# Patient Record
Sex: Male | Born: 1935 | Race: Asian | Hispanic: No | Smoking: Never smoker
Health system: Southern US, Community
[De-identification: ages and names within clinical notes are randomized; demographics above are authoritative.]

## PROBLEM LIST (undated history)

## (undated) DIAGNOSIS — E039 Hypothyroidism, unspecified: Secondary | ICD-10-CM

## (undated) DIAGNOSIS — E119 Type 2 diabetes mellitus without complications: Secondary | ICD-10-CM

## (undated) DIAGNOSIS — E785 Hyperlipidemia, unspecified: Secondary | ICD-10-CM

## (undated) DIAGNOSIS — E878 Other disorders of electrolyte and fluid balance, not elsewhere classified: Secondary | ICD-10-CM

## (undated) DIAGNOSIS — I10 Essential (primary) hypertension: Secondary | ICD-10-CM

## (undated) HISTORY — PX: CHOLECYSTECTOMY: SHX55

## (undated) HISTORY — DX: Type 2 diabetes mellitus without complications: E11.9

## (undated) HISTORY — DX: Hyperlipidemia, unspecified: E78.5

## (undated) HISTORY — PX: BACK SURGERY: SHX140

---

## 1998-10-13 ENCOUNTER — Ambulatory Visit (HOSPITAL_COMMUNITY): Admission: RE | Admit: 1998-10-13 | Discharge: 1998-10-13 | Payer: Self-pay | Admitting: Gastroenterology

## 1999-01-09 ENCOUNTER — Encounter: Admission: RE | Admit: 1999-01-09 | Discharge: 1999-01-09 | Payer: Self-pay | Admitting: Emergency Medicine

## 1999-01-09 ENCOUNTER — Encounter: Payer: Self-pay | Admitting: Emergency Medicine

## 1999-10-03 ENCOUNTER — Encounter: Payer: Self-pay | Admitting: Gastroenterology

## 1999-10-03 ENCOUNTER — Encounter: Admission: RE | Admit: 1999-10-03 | Discharge: 1999-10-03 | Payer: Self-pay | Admitting: Gastroenterology

## 2000-06-20 ENCOUNTER — Encounter (INDEPENDENT_AMBULATORY_CARE_PROVIDER_SITE_OTHER): Payer: Self-pay | Admitting: *Deleted

## 2000-06-20 ENCOUNTER — Encounter: Payer: Self-pay | Admitting: Gastroenterology

## 2000-06-20 ENCOUNTER — Encounter: Admission: RE | Admit: 2000-06-20 | Discharge: 2000-06-20 | Payer: Self-pay | Admitting: Gastroenterology

## 2000-07-02 ENCOUNTER — Encounter: Admission: RE | Admit: 2000-07-02 | Discharge: 2000-07-02 | Payer: Self-pay | Admitting: Gastroenterology

## 2000-07-02 ENCOUNTER — Encounter: Payer: Self-pay | Admitting: Gastroenterology

## 2000-07-02 ENCOUNTER — Encounter (INDEPENDENT_AMBULATORY_CARE_PROVIDER_SITE_OTHER): Payer: Self-pay | Admitting: *Deleted

## 2000-07-18 ENCOUNTER — Encounter (INDEPENDENT_AMBULATORY_CARE_PROVIDER_SITE_OTHER): Payer: Self-pay | Admitting: *Deleted

## 2000-07-18 ENCOUNTER — Encounter: Payer: Self-pay | Admitting: Cardiovascular Disease

## 2000-07-18 ENCOUNTER — Encounter: Admission: RE | Admit: 2000-07-18 | Discharge: 2000-07-18 | Payer: Self-pay | Admitting: Cardiovascular Disease

## 2000-07-22 ENCOUNTER — Ambulatory Visit (HOSPITAL_COMMUNITY): Admission: RE | Admit: 2000-07-22 | Discharge: 2000-07-22 | Payer: Self-pay | Admitting: Cardiovascular Disease

## 2000-10-09 ENCOUNTER — Encounter (INDEPENDENT_AMBULATORY_CARE_PROVIDER_SITE_OTHER): Payer: Self-pay | Admitting: *Deleted

## 2000-10-09 ENCOUNTER — Encounter: Payer: Self-pay | Admitting: Gastroenterology

## 2000-10-09 ENCOUNTER — Encounter: Admission: RE | Admit: 2000-10-09 | Discharge: 2000-10-09 | Payer: Self-pay | Admitting: Gastroenterology

## 2000-10-29 ENCOUNTER — Encounter: Admission: RE | Admit: 2000-10-29 | Discharge: 2000-10-29 | Payer: Self-pay | Admitting: Orthopedic Surgery

## 2000-10-29 ENCOUNTER — Encounter: Payer: Self-pay | Admitting: Orthopedic Surgery

## 2000-10-30 ENCOUNTER — Ambulatory Visit (HOSPITAL_BASED_OUTPATIENT_CLINIC_OR_DEPARTMENT_OTHER): Admission: RE | Admit: 2000-10-30 | Discharge: 2000-10-30 | Payer: Self-pay | Admitting: Orthopedic Surgery

## 2000-11-14 ENCOUNTER — Encounter: Admission: RE | Admit: 2000-11-14 | Discharge: 2000-12-11 | Payer: Self-pay | Admitting: Orthopedic Surgery

## 2002-04-03 ENCOUNTER — Ambulatory Visit (HOSPITAL_COMMUNITY): Admission: RE | Admit: 2002-04-03 | Discharge: 2002-04-03 | Payer: Self-pay

## 2002-04-03 ENCOUNTER — Encounter (INDEPENDENT_AMBULATORY_CARE_PROVIDER_SITE_OTHER): Payer: Self-pay | Admitting: *Deleted

## 2002-12-18 ENCOUNTER — Encounter (INDEPENDENT_AMBULATORY_CARE_PROVIDER_SITE_OTHER): Payer: Self-pay | Admitting: Specialist

## 2002-12-18 ENCOUNTER — Inpatient Hospital Stay (HOSPITAL_COMMUNITY): Admission: RE | Admit: 2002-12-18 | Discharge: 2002-12-20 | Payer: Self-pay | Admitting: Urology

## 2005-07-10 ENCOUNTER — Ambulatory Visit: Payer: Self-pay | Admitting: Internal Medicine

## 2006-09-02 ENCOUNTER — Ambulatory Visit: Payer: Self-pay | Admitting: Internal Medicine

## 2006-09-02 DIAGNOSIS — Z8719 Personal history of other diseases of the digestive system: Secondary | ICD-10-CM | POA: Insufficient documentation

## 2006-09-02 DIAGNOSIS — E039 Hypothyroidism, unspecified: Secondary | ICD-10-CM | POA: Insufficient documentation

## 2006-09-02 DIAGNOSIS — R079 Chest pain, unspecified: Secondary | ICD-10-CM | POA: Insufficient documentation

## 2006-09-02 DIAGNOSIS — E119 Type 2 diabetes mellitus without complications: Secondary | ICD-10-CM | POA: Insufficient documentation

## 2006-09-02 DIAGNOSIS — F5102 Adjustment insomnia: Secondary | ICD-10-CM | POA: Insufficient documentation

## 2006-09-02 LAB — CONVERTED CEMR LAB
Bilirubin Urine: NEGATIVE
Glucose, Urine, Semiquant: NEGATIVE
Ketones, urine, test strip: NEGATIVE
Nitrite: NEGATIVE
Specific Gravity, Urine: 1.025

## 2006-09-03 ENCOUNTER — Ambulatory Visit: Payer: Self-pay | Admitting: Internal Medicine

## 2006-09-03 LAB — CONVERTED CEMR LAB
CO2: 28 meq/L (ref 19–32)
Calcium: 9.3 mg/dL (ref 8.4–10.5)
Chloride: 102 meq/L (ref 96–112)
Glucose, Bld: 132 mg/dL — ABNORMAL HIGH (ref 70–99)
INR: 1 (ref 0.9–2.0)
Lymphocytes Relative: 35.8 % (ref 12.0–46.0)
MCV: 93.3 fL (ref 78.0–100.0)
Monocytes Relative: 8.8 % (ref 3.0–11.0)
Neutro Abs: 2.8 10*3/uL (ref 1.4–7.7)
Platelets: 207 10*3/uL (ref 150–400)
Potassium: 4.3 meq/L (ref 3.5–5.1)
RBC: 4.38 M/uL (ref 4.22–5.81)
WBC: 6 10*3/uL (ref 4.5–10.5)

## 2006-09-05 ENCOUNTER — Encounter: Payer: Self-pay | Admitting: Internal Medicine

## 2006-09-05 ENCOUNTER — Ambulatory Visit: Payer: Self-pay | Admitting: Internal Medicine

## 2006-09-05 ENCOUNTER — Inpatient Hospital Stay (HOSPITAL_BASED_OUTPATIENT_CLINIC_OR_DEPARTMENT_OTHER): Admission: RE | Admit: 2006-09-05 | Discharge: 2006-09-05 | Payer: Self-pay | Admitting: Internal Medicine

## 2006-09-17 ENCOUNTER — Ambulatory Visit: Payer: Self-pay | Admitting: Internal Medicine

## 2006-09-17 LAB — CONVERTED CEMR LAB
CO2: 27 meq/L (ref 19–32)
Creatinine, Ser: 1.2 mg/dL (ref 0.4–1.5)
GFR calc Af Amer: 75 mL/min
GFR calc non Af Amer: 62 mL/min
Glucose, Bld: 143 mg/dL — ABNORMAL HIGH (ref 70–99)
Sodium: 136 meq/L (ref 135–145)

## 2006-09-19 ENCOUNTER — Encounter (HOSPITAL_COMMUNITY): Admission: RE | Admit: 2006-09-19 | Discharge: 2006-10-13 | Payer: Self-pay | Admitting: Internal Medicine

## 2006-09-23 ENCOUNTER — Ambulatory Visit: Payer: Self-pay | Admitting: Cardiology

## 2006-09-23 ENCOUNTER — Encounter (INDEPENDENT_AMBULATORY_CARE_PROVIDER_SITE_OTHER): Payer: Self-pay | Admitting: *Deleted

## 2006-10-09 ENCOUNTER — Encounter (INDEPENDENT_AMBULATORY_CARE_PROVIDER_SITE_OTHER): Payer: Self-pay | Admitting: *Deleted

## 2007-01-06 ENCOUNTER — Encounter: Payer: Self-pay | Admitting: Internal Medicine

## 2007-02-19 ENCOUNTER — Encounter: Payer: Self-pay | Admitting: Internal Medicine

## 2007-03-17 ENCOUNTER — Ambulatory Visit: Payer: Self-pay | Admitting: Internal Medicine

## 2007-03-17 DIAGNOSIS — I2510 Atherosclerotic heart disease of native coronary artery without angina pectoris: Secondary | ICD-10-CM | POA: Insufficient documentation

## 2007-03-17 DIAGNOSIS — E785 Hyperlipidemia, unspecified: Secondary | ICD-10-CM

## 2007-03-21 ENCOUNTER — Telehealth (INDEPENDENT_AMBULATORY_CARE_PROVIDER_SITE_OTHER): Payer: Self-pay | Admitting: *Deleted

## 2007-03-21 LAB — CONVERTED CEMR LAB
ALT: 23 units/L (ref 0–53)
Hgb A1c MFr Bld: 6.3 % — ABNORMAL HIGH (ref 4.6–6.0)
Microalb, Ur: 1.5 mg/dL (ref 0.0–1.9)

## 2007-06-16 ENCOUNTER — Ambulatory Visit: Payer: Self-pay | Admitting: *Deleted

## 2007-06-17 ENCOUNTER — Encounter (INDEPENDENT_AMBULATORY_CARE_PROVIDER_SITE_OTHER): Payer: Self-pay | Admitting: *Deleted

## 2007-06-17 ENCOUNTER — Observation Stay (HOSPITAL_COMMUNITY): Admission: EM | Admit: 2007-06-17 | Discharge: 2007-06-17 | Payer: Self-pay | Admitting: Emergency Medicine

## 2007-06-18 ENCOUNTER — Telehealth (INDEPENDENT_AMBULATORY_CARE_PROVIDER_SITE_OTHER): Payer: Self-pay | Admitting: *Deleted

## 2007-06-18 ENCOUNTER — Ambulatory Visit: Payer: Self-pay | Admitting: Internal Medicine

## 2007-06-18 DIAGNOSIS — I10 Essential (primary) hypertension: Secondary | ICD-10-CM | POA: Insufficient documentation

## 2007-06-18 DIAGNOSIS — R339 Retention of urine, unspecified: Secondary | ICD-10-CM

## 2007-11-19 ENCOUNTER — Encounter: Admission: RE | Admit: 2007-11-19 | Discharge: 2007-12-29 | Payer: Self-pay | Admitting: Specialist

## 2007-11-20 ENCOUNTER — Encounter (INDEPENDENT_AMBULATORY_CARE_PROVIDER_SITE_OTHER): Payer: Self-pay | Admitting: *Deleted

## 2007-11-20 ENCOUNTER — Ambulatory Visit (HOSPITAL_COMMUNITY): Admission: RE | Admit: 2007-11-20 | Discharge: 2007-11-20 | Payer: Self-pay | Admitting: Specialist

## 2007-12-04 ENCOUNTER — Ambulatory Visit (HOSPITAL_COMMUNITY): Admission: RE | Admit: 2007-12-04 | Discharge: 2007-12-04 | Payer: Self-pay | Admitting: Specialist

## 2008-02-25 ENCOUNTER — Encounter: Payer: Self-pay | Admitting: Internal Medicine

## 2008-04-21 ENCOUNTER — Encounter: Payer: Self-pay | Admitting: Internal Medicine

## 2008-06-07 ENCOUNTER — Encounter: Payer: Self-pay | Admitting: Internal Medicine

## 2008-10-06 ENCOUNTER — Encounter: Admission: RE | Admit: 2008-10-06 | Discharge: 2008-10-11 | Payer: Self-pay | Admitting: Specialist

## 2008-11-28 ENCOUNTER — Inpatient Hospital Stay (HOSPITAL_COMMUNITY): Admission: EM | Admit: 2008-11-28 | Discharge: 2008-11-30 | Payer: Self-pay | Admitting: Emergency Medicine

## 2008-11-29 ENCOUNTER — Encounter (INDEPENDENT_AMBULATORY_CARE_PROVIDER_SITE_OTHER): Payer: Self-pay | Admitting: Internal Medicine

## 2008-12-08 ENCOUNTER — Encounter: Admission: RE | Admit: 2008-12-08 | Discharge: 2009-01-10 | Payer: Self-pay | Admitting: Internal Medicine

## 2008-12-30 ENCOUNTER — Encounter: Payer: Self-pay | Admitting: Internal Medicine

## 2009-05-09 ENCOUNTER — Encounter: Payer: Self-pay | Admitting: Internal Medicine

## 2009-07-18 ENCOUNTER — Encounter: Payer: Self-pay | Admitting: Internal Medicine

## 2009-10-05 ENCOUNTER — Encounter: Payer: Self-pay | Admitting: Internal Medicine

## 2009-10-05 ENCOUNTER — Encounter: Admission: RE | Admit: 2009-10-05 | Discharge: 2009-10-05 | Payer: Self-pay | Admitting: Internal Medicine

## 2009-10-05 ENCOUNTER — Encounter (INDEPENDENT_AMBULATORY_CARE_PROVIDER_SITE_OTHER): Payer: Self-pay | Admitting: *Deleted

## 2009-10-19 ENCOUNTER — Encounter: Payer: Self-pay | Admitting: Internal Medicine

## 2009-10-21 ENCOUNTER — Encounter (INDEPENDENT_AMBULATORY_CARE_PROVIDER_SITE_OTHER): Payer: Self-pay | Admitting: *Deleted

## 2009-10-28 DIAGNOSIS — R109 Unspecified abdominal pain: Secondary | ICD-10-CM | POA: Insufficient documentation

## 2009-10-28 DIAGNOSIS — R197 Diarrhea, unspecified: Secondary | ICD-10-CM

## 2009-10-28 DIAGNOSIS — M129 Arthropathy, unspecified: Secondary | ICD-10-CM | POA: Insufficient documentation

## 2009-10-28 DIAGNOSIS — A048 Other specified bacterial intestinal infections: Secondary | ICD-10-CM | POA: Insufficient documentation

## 2009-10-28 DIAGNOSIS — F329 Major depressive disorder, single episode, unspecified: Secondary | ICD-10-CM

## 2009-10-28 DIAGNOSIS — N320 Bladder-neck obstruction: Secondary | ICD-10-CM

## 2009-11-15 ENCOUNTER — Telehealth: Payer: Self-pay | Admitting: Internal Medicine

## 2010-03-05 ENCOUNTER — Encounter: Payer: Self-pay | Admitting: Emergency Medicine

## 2010-03-14 NOTE — Letter (Signed)
Summary: transforaminal steroid injections not helping much  [facility]   Imported By: Lanelle Bal 05/14/2009 10:21:51  _____________________________________________________________________  External Attachment:    Type:   Image     Comment:   External Document

## 2010-03-14 NOTE — Discharge Summary (Signed)
Summary: Abdominal Pain  NAME:  Logan Rush, Logan Rush            ACCOUNT NO.:  [id number]      MEDICAL RECORD NO.:  [id number]          PATIENT TYPE:  INP      LOCATION:  6526                         FACILITY:  [facility]      PHYSICIAN:  Veverly Fells. Excell Seltzer, MD  DATE OF BIRTH:  1926/06/29      DATE OF ADMISSION:  06/16/2007   DATE OF DISCHARGE:  06/17/2007                                  DISCHARGE SUMMARY      PRIMARY CARDIOLOGIST:  Bevelyn Buckles. Bensimhon, MD      UROLOGIST:  Bertram Millard. Dahlstedt, MD      DISCHARGE DIAGNOSIS:  Abdominal discomfort.      SECONDARY DIAGNOSES:   1. Urinary retention.   2. History of bladder outlet obstruction.   3. Status post transurethral resection of the prostate.   4. Hypothyroidism.   5. Nonobstructive coronary artery disease.   6. Hypertension.   7. Hyperlipidemia.   8. Arthritis.   9. History of borderline diabetes.      ALLERGIES:  No known drug allergies.      PROCEDURES:  None.      HISTORY OF PRESENT ILLNESS:  A 77 year old [place] male with a prior   history of nonobstructive CAD by catheterization in 2008.  He was on   previously on medications for hypertension and hyperlipidemia; however,   he has come off of all his medicines except his thyroid medicine and   aspirin.  He also has a history of bladder outlet obstruction, status   post TURP in 2004.  He noted on the morning of May 4 that he was having   difficulty urinating with lower abdominal discomfort and bloating.  This   radiated up into his abdomen and he subsequently presented to [facility]   [facility] on the evening of May 4.  For some reason, cardiology was contacted   and the patient was admitted to our service.      HOSPITAL COURSE:  [name] ruled out for MI.  At no point did he   complain of chest pain or dyspnea.  Following placement of a Foley   catheter in the emergency room with 400 mL of immediate urine output and   subsequent 2600 mL overnight, he has completely symptom-free.   We have   reinitiated Flomax therapy and have arranged for him to follow up with   Dr. Retta Diones next week.      DISCHARGE LABS:  Hemoglobin 13.4, hematocrit 37.9, WBC 8.3, platelets   188.  Sodium 138, potassium 4.3, chloride 104, CO2 25, BUN 8, creatinine   0.99, glucose 150, total bilirubin 1.6, alkaline phosphatase 42, AST 20,   ALT 22, total protein 6.7, albumin 3.9, calcium 9.4.  Lipase 38.  CK 79,   MB 1.7, troponin-I 0.03.  BNP 69.  Urinalysis was negative.      DISPOSITION:  The patient is being discharged home today in good   condition.      FOLLOW-UP PLANS AND APPOINTMENTS:  He will follow up with Dr. Retta Diones   on May 12  1:45 p.m.  we have asked him to follow up with Dr. Drue Novel in the   next couple of weeks.      DISCHARGE MEDICATIONS:   1. Aspirin 81 mg daily.   2. Synthroid 100 mcg daily.   3. Flomax 0.4 mg daily.   4. We have recommended that the patient reinitiate his simvastatin       Carvedilol, which he was taking in August 2008; however, he wants       to discuss this over with Dr. Drue Novel.      OUTSTANDING LAB STUDIES:  None.      DURATION OF DISCHARGE ENCOUNTER:  60 minutes including physician time.               Nicolasa Ducking, ANP               Veverly Fells. Excell Seltzer, MD   Electronically Signed         CB/MEDQ  D:  06/17/2007  T:  06/17/2007  Job:  [id number]      cc:   Willow Ora, MD   Bertram Millard. Dahlstedt, M.D.

## 2010-03-14 NOTE — Letter (Signed)
Summary: [facility]   Imported By: Sherian Rein 10/29/2009 08:21:58  _____________________________________________________________________  External Attachment:    Type:   Image     Comment:   External Document

## 2010-03-14 NOTE — Letter (Signed)
Summary: New Patient letter  [facility]  [address] [place], [place] [address]   Phone: [phone]  Fax: [phone]       10/21/2009 MRN: [id number]  [facility] [address] [place], [place]  [address]  Dear Mr. Kuehl,  Welcome to the Gastroenterology Division at [facility].    You are scheduled to see Dr.  Juanda Chance on 11-04-09 at 10:30A.M. on the 3rd floor at [facility], [address].  We ask that you try to arrive at our office 15 minutes prior to your appointment time to allow for check-in.  We would like you to complete the enclosed self-administered evaluation form prior to your visit and bring it with you on the day of your appointment.  We will review it with you.  Also, please bring a complete list of all your medications or, if you prefer, bring the medication bottles and we will list them.  Please bring your insurance card so that we may make a copy of it.  If your insurance requires a referral to see a specialist, please bring your referral form from your primary care physician.  Co-payments are due at the time of your visit and may be paid by cash, check or credit card.     Your office visit will consist of a consult with your physician (includes a physical exam), any laboratory testing he/she may order, scheduling of any necessary diagnostic testing (e.g. x-ray, ultrasound, CT-scan), and scheduling of a procedure (e.g. Endoscopy, Colonoscopy) if required.  Please allow enough time on your schedule to allow for any/all of these possibilities.    If you cannot keep your appointment, please call [phone] to cancel or reschedule prior to your appointment date.  This allows [place] the opportunity to schedule an appointment for another patient in need of care.  If you do not cancel or reschedule by 5 p.m. the business day prior to your appointment date, you will be charged a $50.00 late cancellation/no-show fee.    Thank you for choosing  [facility] for your medical needs.  We appreciate the opportunity to care for you.  Please visit [place] at our website  to learn more about our practice.                     Sincerely,                                                             The Gastroenterology Division

## 2010-03-14 NOTE — Letter (Signed)
Summary: [facility]  [facility]   Imported By: Lanelle Bal 04/29/2009 12:14:00  _____________________________________________________________________  External Attachment:    Type:   Image     Comment:   External Document

## 2010-03-14 NOTE — Letter (Signed)
Summary: [facility]   Imported By: Sherian Rein 10/29/2009 08:18:59  _____________________________________________________________________  External Attachment:    Type:   Image     Comment:   External Document

## 2010-03-14 NOTE — Progress Notes (Signed)
Summary: Patient to Bring Previous GI Records  ---- Converted from flag ---- ---- 11/03/2009 4:19 PM, Karen Kitchens Nelson-Smith CMA (AAMA) wrote: daughter in law to bring records!  ---- 11/02/2009 7:45 AM, Hart Carwin MD wrote: Thanks hopefully I will get 24 hours to review them.  ---- 10/28/2009 3:57 PM, Karen Kitchens Nelson-Smith CMA (AAMA) wrote: Patient has appointment with [place] on 11/04/09. he has hx with Dr Loreta Ave, last in 2008. Patient's daughter says she will get records and bring them to patients appointment ...... ------------------------------

## 2010-03-14 NOTE — Letter (Signed)
Summary: status post back surgery 5/11, better  [facility]   Imported By: Lanelle Bal 07/28/2009 10:10:30  _____________________________________________________________________  External Attachment:    Type:   Image     Comment:   External Document

## 2010-04-19 ENCOUNTER — Encounter: Payer: Self-pay | Admitting: Internal Medicine

## 2010-05-11 NOTE — Letter (Signed)
Summary: [facility]  [facility]   Imported By: Kassie Mends 04/28/2010 08:31:02  _____________________________________________________________________  External Attachment:    Type:   Image     Comment:   External Document

## 2010-05-18 LAB — GLUCOSE, CAPILLARY
Glucose-Capillary: 104 mg/dL — ABNORMAL HIGH (ref 70–99)
Glucose-Capillary: 116 mg/dL — ABNORMAL HIGH (ref 70–99)
Glucose-Capillary: 116 mg/dL — ABNORMAL HIGH (ref 70–99)
Glucose-Capillary: 159 mg/dL — ABNORMAL HIGH (ref 70–99)
Glucose-Capillary: 181 mg/dL — ABNORMAL HIGH (ref 70–99)

## 2010-05-18 LAB — DIFFERENTIAL
Basophils Relative: 0 % (ref 0–1)
Lymphs Abs: 1.9 10*3/uL (ref 0.7–4.0)
Monocytes Absolute: 0.6 10*3/uL (ref 0.1–1.0)
Monocytes Relative: 10 % (ref 3–12)

## 2010-05-18 LAB — HOMOCYSTEINE: Homocysteine: 19.9 umol/L — ABNORMAL HIGH (ref 4.0–15.4)

## 2010-05-18 LAB — CBC
HCT: 39 % (ref 39.0–52.0)
Hemoglobin: 13.2 g/dL (ref 13.0–17.0)
MCHC: 34 g/dL (ref 30.0–36.0)
Platelets: 212 10*3/uL (ref 150–400)
RBC: 3.94 MIL/uL — ABNORMAL LOW (ref 4.22–5.81)
RDW: 13.9 % (ref 11.5–15.5)

## 2010-05-18 LAB — CARDIAC PANEL(CRET KIN+CKTOT+MB+TROPI)
Troponin I: 0.01 ng/mL (ref 0.00–0.06)
Troponin I: 0.01 ng/mL (ref 0.00–0.06)

## 2010-05-18 LAB — COMPREHENSIVE METABOLIC PANEL
AST: 37 U/L (ref 0–37)
Albumin: 4 g/dL (ref 3.5–5.2)
BUN: 22 mg/dL (ref 6–23)
CO2: 23 mEq/L (ref 19–32)
Calcium: 9.4 mg/dL (ref 8.4–10.5)
Chloride: 106 mEq/L (ref 96–112)
Creatinine, Ser: 1.3 mg/dL (ref 0.4–1.5)
GFR calc Af Amer: >60 mL/min (ref >60)
Total Protein: 7 g/dL (ref 6.0–8.3)

## 2010-05-18 LAB — TSH: TSH: 2.478 u[IU]/mL (ref 0.350–4.500)

## 2010-05-18 LAB — URINALYSIS, ROUTINE W REFLEX MICROSCOPIC
Hgb urine dipstick: NEGATIVE
Ketones, ur: NEGATIVE mg/dL
Protein, ur: NEGATIVE mg/dL
Urobilinogen, UA: 0.2 mg/dL (ref 0.0–1.0)

## 2010-05-18 LAB — LIPID PANEL: HDL: 32 mg/dL — ABNORMAL LOW (ref >39)

## 2010-05-18 LAB — APTT: aPTT: 26 seconds (ref 24–37)

## 2010-05-18 LAB — CK TOTAL AND CKMB (NOT AT ARMC): CK, MB: 5.8 ng/mL — ABNORMAL HIGH (ref 0.3–4.0)

## 2010-05-18 LAB — HEMOGLOBIN A1C: Hgb A1c MFr Bld: 6.2 % — ABNORMAL HIGH (ref 4.6–6.1)

## 2010-06-01 ENCOUNTER — Other Ambulatory Visit: Payer: Self-pay | Admitting: Internal Medicine

## 2010-06-02 ENCOUNTER — Ambulatory Visit
Admission: RE | Admit: 2010-06-02 | Discharge: 2010-06-02 | Disposition: A | Discharge status: Home / Self Care | Payer: Medicare PPO | Source: Ambulatory Visit | Attending: Internal Medicine | Admitting: Internal Medicine

## 2010-06-27 NOTE — Discharge Summary (Signed)
NAMEASEEM, Logan Rush            ACCOUNT NO.:  [id number]   MEDICAL RECORD NO.:  [id number]          PATIENT TYPE:  INP   LOCATION:  6526                         FACILITY:  [facility]   PHYSICIAN:  Veverly Fells. Excell Seltzer, MD  DATE OF BIRTH:  [birth year]   DATE OF ADMISSION:  06/16/2007  DATE OF DISCHARGE:  06/17/2007                               DISCHARGE SUMMARY   PRIMARY CARDIOLOGIST:  Bevelyn Buckles. Bensimhon, MD   UROLOGIST:  Bertram Millard. Dahlstedt, MD   DISCHARGE DIAGNOSIS:  Abdominal discomfort.   SECONDARY DIAGNOSES:  1. Urinary retention.  2. History of bladder outlet obstruction.  3. Status post transurethral resection of the prostate.  4. Hypothyroidism.  5. Nonobstructive coronary artery disease.  6. Hypertension.  7. Hyperlipidemia.  8. Arthritis.  9. History of borderline diabetes.   ALLERGIES:  No known drug allergies.   PROCEDURES:  None.   HISTORY OF PRESENT ILLNESS:  A 77 year old [place] male with a prior  history of nonobstructive CAD by catheterization in 2008.  He was on  previously on medications for hypertension and hyperlipidemia; however,  he has come off of all his medicines except his thyroid medicine and  aspirin.  He also has a history of bladder outlet obstruction, status  post TURP in 2004.  He noted on the morning of May 4 that he was having  difficulty urinating with lower abdominal discomfort and bloating.  This  radiated up into his abdomen and he subsequently presented to [facility]  [facility] on the evening of May 4.  For some reason, cardiology was contacted  and the patient was admitted to our service.   HOSPITAL COURSE:  Mr. Seitzinger ruled out for MI.  At no point did he  complain of chest pain or dyspnea.  Following placement of a Foley  catheter in the emergency room with 400 mL of immediate urine output and  subsequent 2600 mL overnight, he has completely symptom-free.  We have  reinitiated Flomax therapy and have arranged for him to follow up with  Dr.  Retta Diones next week.   DISCHARGE LABS:  Hemoglobin 13.4, hematocrit 37.9, WBC 8.3, platelets  188.  Sodium 138, potassium 4.3, chloride 104, CO2 25, BUN 8, creatinine  0.99, glucose 150, total bilirubin 1.6, alkaline phosphatase 42, AST 20,  ALT 22, total protein 6.7, albumin 3.9, calcium 9.4.  Lipase 38.  CK 79,  MB 1.7, troponin-I 0.03.  BNP 69.  Urinalysis was negative.   DISPOSITION:  The patient is being discharged home today in good  condition.   FOLLOW-UP PLANS AND APPOINTMENTS:  He will follow up with Dr. Retta Diones  on May 12 1:45 p.m.  we have asked him to follow up with Dr. Drue Novel in the  next couple of weeks.   DISCHARGE MEDICATIONS:  1. Aspirin 81 mg daily.  2. Synthroid 100 mcg daily.  3. Flomax 0.4 mg daily.  4. We have recommended that the patient reinitiate his simvastatin      Carvedilol, which he was taking in August 2008; however, he wants      to discuss this over with Dr. Drue Novel.  OUTSTANDING LAB STUDIES:  None.   DURATION OF DISCHARGE ENCOUNTER:  60 minutes including physician time.      Nicolasa Ducking, ANP      Veverly Fells. Excell Seltzer, MD  Electronically Signed    CB/MEDQ  D:  06/17/2007  T:  06/17/2007  Job:  [id number]   cc:   Willow Ora, MD  Bertram Millard. Dahlstedt, M.D.

## 2010-06-27 NOTE — Assessment & Plan Note (Signed)
[facility]                            CARDIOLOGY OFFICE NOTE   NAME:Logan Rush, Logan Rush                   MRN:          [id number]  DATE:09/03/2006                            DOB:          07-14-1926    REFERRING PHYSICIAN:  Willow Ora, MD   REASON FOR CONSULTATION:  Chest pain.   HISTORY OF PRESENT ILLNESS:  Logan Rush is a very pleasant 77 year old  male who presents today with his daughter.  He has no history of known  coronary artery disease or other heart problems.  He does have a history  of hypertension, hyperlipidemia and borderline diabetes for the past  three to four years.  He tells me that he had a stress test in April  2008, in [place], just as a routine screening.  It appears that this was  just a routine treadmill test and no imaging was done.  He recently got  back after spending 40 days in [place].  He came back about two weeks ago.  Since he has been back, he has been noticing his blood pressure has been  higher than normal.  This past Saturday he woke up at 5 a.m. with a  significant heaviness in his chest and diaphoresis.  There was some  radiation to his back and some mild shortness of breath.  This lasted  for about one hour.  It finally resolved.  Since that time he says that  when he walks he notices he has some shortness of breath and maybe just  minimal chest tightness but nothing severe.  He has not had any  recurrent night time angina.  He also notes some mild lower extremity  edema but nothing profound.  He saw Dr. Willow Ora, who started him on an  aspirin and gave him some p.r.n. nitroglycerin and referred him here.  Currently he is asymptomatic.   REVIEW OF SYSTEMS:  Is also notable for some pain in his knees and  thighs when he walks.  He has thyroid disease and fatigue and  constipation.  The remainder of the review of systems is negative.  He  denies any bleeding or bright red blood per rectum.   PAST MEDICAL HISTORY:  1. Borderline diabetes x3-4 years.  2. Hypertension.  3. Hyperlipidemia.  4. Hypothyroidism.  5. Arthritis.   CURRENT MEDICATIONS:  1. Synthroid 100 mcg daily.  2. Glycomet  850 mg, 1/2 tab b.i.d.  3. Xanax 0.5 mg q.h.s.  4. Aspirin 81 mg daily.   ALLERGIES:  No known drug allergies.   SOCIAL HISTORY:  He is widowed.  He is retired.  He used to own hotels.  He has been in [place] for 30 years.  Denies any tobacco or alcohol.   FAMILY HISTORY:  Mother and father are both dead of unknown causes.  Also had a brother who is deceased.  No family history of premature  coronary artery disease.   PHYSICAL EXAMINATION:  GENERAL:  He is well-appearing, in no acute  distress.  He speaks in broken Logan Rush.  VITAL SIGNS:  Respirations are unlabored.  Blood pressure 144/80, heart  rate 83, weight 185 pounds.  HEENT:  Normal.  NECK:  Supple.  No jugular venous distention.  Carotids are 2+  bilaterally without bruits.  There is no lymphadenopathy or thyromegaly.  HEART:  PMI is not displaced.  Irregular rate and rhythm.  No murmurs,  rubs or gallops.  LUNGS:  Clear.  ABDOMEN:  Obese, nontender, non-distended.  There is no  hepatosplenomegaly, no bruits, no masses.  Good bowel sounds.  EXTREMITIES:  Warm with no clubbing, cyanosis or edema.  Right groin:  Femoral pulses 2+ with no bruits.  NEUROLOGIC:  Alert and oriented x3.  Cranial nerves II-XII  are intact.  Moves all four extremities without difficulty.  Affect is pleasant.   Electrocardiogram shows sinus rhythm with just minimal T-wave flattening  in the high lateral leads.   ASSESSMENT/PLAN:  1. Chest pain and diaphoresis.  This is very concerning for myocardial      ischemia:  We will set him up for an outpatient cardiac      catheterization in the [facility] laboratory on Thursday.  I told him if      his symptoms progress, that he will obviously need to be admitted      and undergo a cardiac catheterization in the main laboratory.   We      will start him on Imdur 30 mg daily as well as Coreg 6.25 mg b.i.d.  2. Hypertension, mildly elevated:  Staring Coreg 6.25 mg b.i.d. and      titrate as needed.   DISPOSITION:  Pending the results of his heart catheterization.  Of  note, we have also started him on a statin with Zocor 40 mg daily.     Bevelyn Buckles. Bensimhon, MD  Electronically Signed    DRB/MedQ  DD: 09/03/2006  DT: 09/04/2006  Job #: [id number]

## 2010-06-27 NOTE — H&P (Signed)
NAMEMIDAS, DAUGHETY            ACCOUNT NO.:  [id number]   MEDICAL RECORD NO.:  [id number]          PATIENT TYPE:  INP   LOCATION:  6526                         FACILITY:  [facility]   PHYSICIAN:  Rod Holler, MD     DATE OF BIRTH:  Jun 07, 1926   DATE OF ADMISSION:  06/16/2007  DATE OF DISCHARGE:                              HISTORY & PHYSICAL   PRIMARY CARDIOLOGIST:  Bevelyn Buckles. Bensimhon, M.D.   PRIMARY CARE PHYSICIAN:  Willow Ora, M.D.   CHIEF COMPLAINT:  Abdominal discomfort.   HISTORY:  Mr. Zoll is a 77 year old male with a history of moderate  nonobstructive coronary artery disease by cardiac catheterization in  2008, who presented to the emergency department with epigastric  discomfort.  Since about noon today, the patient has had intermittent  discomfort in his midepigastric area.  There has been no radiation of  this discomfort.  He has also had some difficulty urinating today.  The  patient has also noted that he has had fluctuating blood pressures as  well today.  He also complains of mild shortness of breath.  The patient  has no complaints of nausea or emesis, no diarrhea, no fevers, chills or  night sweats, and no chest discomfort.  Currently, the patient is pain-  free.  The patient had a Foley catheter placed in the emergency  department with 400 mL of urine.   PAST MEDICAL HISTORY:  1. Hypothyroidism.  2. Hypertension.  3. Hyperlipidemia.  4. Cardiac catheterization in 2008 with 30% proximal LAD, 50% mid LAD,      75% distal LAD, 30% OM1, 99% mid left circumflex, which is a small      vessel, 60% RCA.   MEDICATIONS:  1. Multivitamin 1 tab p.o. daily.  2. Aspirin 81 mg p.o. daily.  3. Sleeping pill.  4. Thyroid medicine.   ALLERGIES:  No known drug allergies.   SOCIAL HISTORY:  The patient is widowed and retired, nonsmoker.   FAMILY HISTORY:  No known history of premature coronary artery disease.   REVIEW OF SYSTEMS:  All systems reviewed in detail and  are negative  except as noted in the history of present illness.   PHYSICAL EXAMINATION:  VITAL SIGNS:  Temperature 97.2, blood pressure  148/84, heart rate 69, respiratory rate 15, and oxygen saturation 100%.  GENERAL:  Well-developed, well-nourished male, alert and oriented x3, no  apparent distress.  HEENT:  Atraumatic and normocephalic.  Pupils equal, round, and reactive  to light.  Extraocular movements intact.  NECK:  Supple.  No adenopathy.  No JVD.  No carotid bruits.  CHEST:  Decreased breath sounds bibasilar, otherwise clear to  auscultation bilaterally with equal bilateral breath sounds.  CORONARY:  Regular rhythm, normal rate, normal S1 and S2.  No murmurs,  rubs, or gallops, 2+ peripheral pulses.  ABDOMEN:  Soft, slightly tender to deep palpation in the midepigastric  region.  No rebound or guarding, active bowel sounds.  EXTREMITIES:  No clubbing, cyanosis, or edema.  NEUROLOGIC:  No focal deficits.   LABORATORY DATA:  Lipase 38, sodium 134, potassium 4.8, chloride 99,  bicarb 27, BUN 11, creatinine 1, glucose 128, total bilirubin 1.6, alk  phos 42, SGOT 28, SGPT 22, total protein 6.7, albumin 3.9, and calcium  9.  White blood cell count 8.3, hematocrit 37.9, and platelet count 188.  CK-MB of less than 1, troponin less than 0.05, and myoglobin 62.   EKG shows normal sinus rhythm, nonspecific ST and T wave changes.  Chest  x-ray shows mild cardiomegaly, mild venous congestion, and bibasilar  atelectasis.   IMPRESSION AND PLAN:  A 77 year old male who presents with midepigastric  discomfort, difficulty urinating, and mild shortness of breath.  1. Cardiovascular.  Admit the patient to a telemetry bed, rule out      serial cardiac enzymes, daily EKG, BNP.  2. Home medicines.  3. Fluids, electrolytes, nutrition, and cardiac diet, BMP in the      morning.  4. Genitourinary.  Discontinue Foley in the morning.  5. Gastrointestinal.  If continued abdominal discomfort, may  need      abdominal ultrasound.      Rod Holler, MD  Electronically Signed     TRK/MEDQ  D:  06/17/2007  T:  06/17/2007  Job:  [id number]

## 2010-06-27 NOTE — Cardiovascular Report (Signed)
[name], [name]            ACCOUNT NO.:  [id number]   MEDICAL RECORD NO.:  [id number]          PATIENT TYPE:  OIB   LOCATION:  1963                         FACILITY:  [facility]   PHYSICIAN:  Bevelyn Buckles. Bensimhon, MDDATE OF BIRTH:  04/25/1926   DATE OF PROCEDURE:  09/05/2006  DATE OF DISCHARGE:                            CARDIAC CATHETERIZATION   PRIMARY CARE PHYSICIAN:  Dr. Willow Ora.   PATIENT IDENTIFICATION:  [name] is a 77 year old male with multiple  cardiac risk factors including hypertension, hyperlipidemia and  diabetes, had an episode of chest pain that woke him up from sleep the  other night.  He was referred to our office and was set up for  diagnostic catheterization in the outpatient catheterization lab.   PROCEDURES PERFORMED:  1. Selective coronary angiography.  2. Left heart cath.  3. Left ventriculogram.  4. Aortic angiogram  5. Right heart cath.   DESCRIPTION OF PROCEDURE:  Risks and benefits of catheterization was  explained to Mr. Knaggs and his  daughter.  Consent was signed and placed  on the chart.  4-French arterial sheath was placed in the right femoral  artery using a modified Seldinger technique.  Standard JL-4, 3-D RC and  angled pigtail were used for procedure.  All catheter changes were made  over wire.  There were  no apparent complications.  A 7-French venous  sheath was also placed in the right femoral vein and standard Swan-Ganz  catheter was used for right heart catheterization.   HEMODYNAMIC RESULTS:  Central aortic pressure is 134/63 with a mean of  91.  LV pressure 116/6 with an EDP of 14.  Right atrial pressure was 10,  RV pressure 31/17.  PA pressure 33/14 with a mean of 22, capillary wedge  pressure mean of 17.  Fick cardiac output was 3.0 and cardiac index was  1.5.   Left main had just minimal luminal irregularities.   LAD was a long vessel wrapping the apex.  It gave off 2 small to  moderate sized diagonals.  There was diffuse  30% plaquing throughout the  proximal LAD.  In the mid LAD there was again a diffuse plaquing with  about 50-60% stenosis throughout.  In the very distal LAD there is a 75%  focal stenosis.   Left circumflex is a moderate-sized vessel, gave off a moderate to large  branching OM1 and a very small OM-2.  The distal AV groove circumflex  was small.  There is a 30-40% tubular lesion in the OM-1 just after the  takeoff of the OM-1 and in the mid left circumflex there was a 99%  stenosis.  Both the distal left circumflex and OM-2 were 1 mm vessels.   Right coronary artery was dominant vessel.  It gave off a PDA and two  posterolaterals.  There was a 60 percent lesion in the proximal to  midportion.   Left ventriculogram done in the RAO position showed an EF of 65%.  There  was no significant wall motion abnormalities and no mitral  regurgitation.   Aortic root shot showed a very mildly dilated root with trivial  aortic  insufficiency.  On panning down over the aorta,  there was a question of  small abdominal aortic aneurysm.   ASSESSMENT:  1. Three-vessel moderate coronary artery disease with high-grade      lesion in the tiny mid left circumflex.  2. Normal LV function.  3. Normal right heart and LV filling pressures.  4. Mild aortic root dilatation with a trivial aortic insufficiency.  5. Question of mild abdominal aortic aneurysm.  6. Decreased cardiac output of unclear etiology.   PLAN/DISCUSSION:  I suspect the left circumflex lesion is the culprit  and it is too small for revascularization.  We will review his LAD  disease with our interventional colleagues.  We will focus on medical  therapy at this point.  Eventually he will need a chest and abdominal CT  to more closely evaluate his aorta.      Bevelyn Buckles. Bensimhon, MD  Electronically Signed     DRB/MEDQ  D:  09/05/2006  T:  09/05/2006  Job:  [id number]   cc:   Willow Ora, MD

## 2010-06-27 NOTE — Assessment & Plan Note (Signed)
[facility]                            CARDIOLOGY OFFICE NOTE   NAME:Staggs, ERVEY FALLIN                   MRN:          [id number]  DATE:09/17/2006                            DOB:          22-Mar-1926    Mr. Ravenscroft returns today for post cardiac catheterization appointment.  He is accompanied by his grandson, who is interpreting for him.  Mr.  Carriger states that he has been doing quite well since he was discharged  home.  He denies any episodes of chest pressure, heaviness, or  tightness.  Complains of mild shortness of breath.  Mr. Alonso states  family member (who is a physician) started him on Glucophage for his  borderline diabetes.  He states he took this for 1 day, and it caused  abdominal cramping and diarrhea, and he stopped it.  Apparently, his  primary care physician is Dr. Drue Novel.  Mr. Chauca recently underwent cardiac  catheterization at [facility].  Patient was found to have 3-vessel  moderate coronary artery disease with high-grade lesion in the tiny mid  left circumflex with normal LV function.  There was question of mild  abdominal aortic aneurysm.  Patient had decreased cardiac output of  unclear etiology.  Dr. Gala Romney had the interventional cardiologist  review patient's films.  It was not felt the left circumflex lesion was  appropriate for revascularization secondary to the size, and he  recommended focusing on medical therapy at this time.  He also wanted  patient to follow up with a chest and abdominal CT to more closely  evaluate his aorta.   PAST MEDICAL HISTORY:  Includes:  1. Coronary artery disease, 3-vessel moderate disease, status post      cardiac catheterization as stated above.  2. Questionable mild abdominal aortic aneurysm pending CT evaluation.  3. Borderline diabetes.  4. Hypertension.  5. Hyperlipidemia.  6. Hypothyroidism.  7. Arthritis.  8. Communication barrier.  Patient is originally from [place].   REVIEW OF  SYSTEMS:  As stated above.   CURRENT MEDICATIONS:  1. Thyroxine 100 mcg daily.  2. Alprazolam 0.5 mg nightly.  3. Aspirin 81 mg daily.  4. Simvastatin 40 mg daily.  5. Carvedilol 6.25 mg b.i.d.  6. Isosorbide 30 mg daily.   PHYSICAL EXAMINATION:  Weight 187 pounds.  Blood pressure 119/63 with a  heart rate of 68.  Twelve-lead EKG shows normal sinus rhythm at a rate of 64.  Mr. Popper is  in no acute distress.  HEENT:  Unremarkable.  NECK:  Supple.  No JVD.  No bruits.  CARDIOVASCULAR:  Exam reveals S1 and S2, regular rate and rhythm.  LUNGS:  Clear to auscultation bilaterally.  ABDOMEN:  Soft and nontender.  Positive bowel sounds.  RIGHT GROIN:  Without bruits, hematoma.  SKIN:  Warm and dry.  LOWER EXTREMITIES:  Without clubbing, cyanosis, or edema.  Positive DPs  bilaterally.  NEUROLOGIC:  Alert and oriented x3.  Ambulating without assistance here  in the office.   IMPRESSION:  1. Coronary artery disease, 3-vessel disease with medical management      at this time.  Patient's angina  appears to be stable.  Will      continue current medical therapy.  2. Questionable abdominal aortic aneurysm.  We will schedule patient      for a CT of the chest and abdomen with contrast.  3. Borderline diabetes.  I have strongly urged patient to follow up      with Dr. Drue Novel for further management of his diabetes, as he      apparently was not tolerant of the metformin he had been given by      an M.D. family member.   Signs and symptoms of angina have been enforced with patient and family  member, along with need to remain compliant with medications.  Patient  to follow up with Dr. Gala Romney after CT scan obtained.      Dorian Pod, ACNP  Electronically Signed      Bevelyn Buckles. Bensimhon, MD  Electronically Signed   MB/MedQ  DD: 09/17/2006  DT: 09/17/2006  Job #: [id number]

## 2010-06-30 NOTE — Op Note (Signed)
Logan Rush, HOLTSCLAW                      ACCOUNT NO.:  [id number]   MEDICAL RECORD NO.:  [id number]                   PATIENT TYPE:  INP   LOCATION:  X004                                 FACILITY:  [facility]   PHYSICIAN:  Bertram Millard. Dahlstedt, M.D.          DATE OF BIRTH:  08-03-1926   DATE OF PROCEDURE:  12/18/2002  DATE OF DISCHARGE:                                 OPERATIVE REPORT   PREOPERATIVE DIAGNOSIS:  Benign prostatic hypertrophy with retention.   POSTOPERATIVE DIAGNOSIS:  Benign prostatic hypertrophy with retention.   PRINCIPAL PROCEDURE:  Transurethral resection of prostate.   SURGEON:  Bertram Millard. Dahlstedt, M.D.   ANESTHESIA:  Subarachnoid block.   COMPLICATIONS:  None.   BRIEF HISTORY:  A 77 year old male with recurrent retention. He has been  treated for benign prostatic hypertrophy for some time and has recently  presented in urinary retention.  He failed voiding trials and at this time  presents for TURP.   Prior to the surgery scheduling, we talked about other treatment options.  These include watchful waiting, leaving a Foley catheter in, teaching self  intermittent catheterization, performing minimally invasive thermotherapy or  TURP. The patient is planning on traveling to [place] soon and has desired to  undergo TURP. He is aware of the risks and complications of this procedure  and desires to proceed.   DESCRIPTION OF PROCEDURE:  The patient was administered a subarachnoid  block. He had received gentamycin preoperatively through the intravenous  routine. He was then placed on the OR table and placed in the dorsal  lithotomy position. Genitalia and perineum were prepped and draped. His  meatus was calibrated to 28 [place] with [employer] sounds. There was slight  meatal tearing with this. A 27 French resectoscope was placed and the  bladder inspected. It was found to be mildly trabeculated. No tumors or  foreign bodies were noted. Ureteral orifices were  noted bilaterally. A TURP  was then performed. Resection was carried out from the base of the prostate  to the apex, taking care to avoid injury to the verumontanum. The prostate  was not very large but minimally obstructive. Resection was carried down to  the surgical capsule circumferentially. Small bleeders were  electrocoagulated. There was excellent visualization from the verumontanum  well into the bladder without any evidence of obstructing lateral lobes.  Chips were irrigated from the bladder and sent for pathology. The irrigation  was turned off and small bleeders were again noted and electrocoagulated. At  this point, the bladder was filled. The scope was removed. There was  easy flow of urine through the urethra. The 36 French Foley with 5 mL  balloon was then placed. It was hooked to three way irrigation.   The patient tolerated the procedure well. He was awakened and taken to the  PACU in stable condition.  Bertram Millard. Dahlstedt, M.D.    SMD/MEDQ  D:  12/18/2002  T:  12/18/2002  Job:  [id number]   cc:   Anselmo Rod, M.D.  [address]  [address]  [place]  [place] [address]  Fax: [phone]

## 2010-07-05 ENCOUNTER — Other Ambulatory Visit: Payer: Self-pay | Admitting: Orthopaedic Surgery

## 2010-07-05 DIAGNOSIS — M25569 Pain in unspecified knee: Secondary | ICD-10-CM

## 2010-07-07 ENCOUNTER — Ambulatory Visit
Admission: RE | Admit: 2010-07-07 | Discharge: 2010-07-07 | Disposition: A | Discharge status: Home / Self Care | Payer: Medicare PPO | Source: Ambulatory Visit | Attending: Orthopaedic Surgery | Admitting: Orthopaedic Surgery

## 2010-07-07 DIAGNOSIS — M25569 Pain in unspecified knee: Secondary | ICD-10-CM

## 2010-07-20 ENCOUNTER — Encounter (HOSPITAL_BASED_OUTPATIENT_CLINIC_OR_DEPARTMENT_OTHER)
Admission: RE | Admit: 2010-07-20 | Discharge: 2010-07-20 | Disposition: A | Discharge status: Home / Self Care | Payer: Medicare PPO | Source: Ambulatory Visit | Attending: Orthopedic Surgery | Admitting: Orthopedic Surgery

## 2010-07-20 LAB — BASIC METABOLIC PANEL
Calcium: 10.2 mg/dL (ref 8.4–10.5)
GFR calc Af Amer: >60 mL/min (ref >60)
GFR calc non Af Amer: >60 mL/min (ref >60)
Glucose, Bld: 113 mg/dL — ABNORMAL HIGH (ref 70–99)
Potassium: 5.5 mEq/L — ABNORMAL HIGH (ref 3.5–5.1)
Sodium: 135 mEq/L (ref 135–145)

## 2010-07-21 ENCOUNTER — Ambulatory Visit (HOSPITAL_BASED_OUTPATIENT_CLINIC_OR_DEPARTMENT_OTHER)
Admission: RE | Admit: 2010-07-21 | Discharge: 2010-07-21 | Disposition: A | Discharge status: Home / Self Care | Payer: Medicare PPO | Source: Ambulatory Visit | Attending: Orthopedic Surgery | Admitting: Orthopedic Surgery

## 2010-07-21 DIAGNOSIS — M23329 Other meniscus derangements, posterior horn of medial meniscus, unspecified knee: Secondary | ICD-10-CM | POA: Insufficient documentation

## 2010-07-21 DIAGNOSIS — Z01810 Encounter for preprocedural cardiovascular examination: Secondary | ICD-10-CM | POA: Insufficient documentation

## 2010-07-21 DIAGNOSIS — M23349 Other meniscus derangements, anterior horn of lateral meniscus, unspecified knee: Secondary | ICD-10-CM | POA: Insufficient documentation

## 2010-07-21 DIAGNOSIS — Z01812 Encounter for preprocedural laboratory examination: Secondary | ICD-10-CM | POA: Insufficient documentation

## 2010-07-21 LAB — POCT HEMOGLOBIN-HEMACUE: Hemoglobin: 13.5 g/dL (ref 13.0–17.0)

## 2010-07-27 ENCOUNTER — Ambulatory Visit
Admission: RE | Admit: 2010-07-27 | Discharge: 2010-07-27 | Disposition: A | Discharge status: Home / Self Care | Payer: Medicare PPO | Source: Ambulatory Visit | Attending: Internal Medicine | Admitting: Internal Medicine

## 2010-07-27 ENCOUNTER — Other Ambulatory Visit: Payer: Self-pay | Admitting: Internal Medicine

## 2010-07-27 DIAGNOSIS — J189 Pneumonia, unspecified organism: Secondary | ICD-10-CM

## 2010-07-27 NOTE — Op Note (Signed)
  NAMEJAMARIE, MUSSA            ACCOUNT NO.:  [id number]  MEDICAL RECORD NO.:  [id number]  LOCATION:                                 FACILITY:  PHYSICIAN:  Eulas Post, MD         DATE OF BIRTH:  DATE OF PROCEDURE:  07/21/2010 DATE OF DISCHARGE:                              OPERATIVE REPORT   PREOPERATIVE DIAGNOSIS:  Left knee medial and lateral meniscal tears.  POSTOPERATIVE DIAGNOSIS:  Left knee medial and lateral meniscal tears.  OPERATIVE PROCEDURE:  Left knee partial medial and lateral meniscectomy.  ATTENDING SURGEON:  Eulas Post, MD  PREOPERATIVE INDICATIONS:  Mr. Slowey is an 77 year old gentleman who is very active and walks at least half a mile per day who developed left- sided medial sharp knee pain.  He had an MRI that demonstrated a torn posterior horn medial meniscus as well as a torn anterior horn lateral meniscus.  He had multiple injections and elected for surgical intervention.  The risks, benefits, and alternatives were discussed before the procedure including but not limited to risks of infection, bleeding, nerve injury, recurrent knee pain, incomplete relief of knee pain, recurrent meniscal tear, progression of arthritis, stiffness, cardiopulmonary complications, among others and he is willing to proceed.  OPERATIVE FINDINGS:  The patellofemoral joint was essentially normal. The ACL and PCL were intact.  The medial and lateral gutters were intact.  The medial femoral condyle had some grade 1 changes, and the medial tibial condyle also had some grade 1 changes.  There was a complex posterior horn medial meniscus tear that extended around the body.  The lateral meniscus was intact posteriorly, but did have fraying of the meniscal insertion anteriorly.  OPERATIVE PROCEDURE:  The patient was brought to the operating room and placed in supine position.  General anesthesia was administered.  The left lower extremity was prepped and draped in the  usual sterile fashion.  Diagnostic arthroscopy was carried out with the above-named findings.  The arthroscopic biter and the arthroscopic shaver was used to debride the posterior horn medial meniscus.  I switched portals to get better access to the anterior portion of the body.  I debrided the tear back to a stable rim.  I also debrided the anterior horn lateral meniscus back to a stable rim. Overall, the joint was remarkably in good condition given his age.  The knee was irrigated copiously and all loose debris was removed and the arthroscopic instruments were removed and the portals were closed with Monocryl followed by Steri-Strips and sterile gauze.  The knee was injected with Marcaine.  He was awakened and returned to the PACU in stable and satisfactory condition.  There were no complications and he tolerated the procedure well.     Eulas Post, MD     JPL/MEDQ  D:  07/21/2010  T:  07/22/2010  Job:  [id number]  Electronically Signed by Teryl Lucy MD on 07/27/2010 09:35:45 AM

## 2010-11-08 LAB — URINALYSIS, ROUTINE W REFLEX MICROSCOPIC
Bilirubin Urine: NEGATIVE
Nitrite: NEGATIVE
Specific Gravity, Urine: 1.014
pH: 7

## 2010-11-08 LAB — COMPREHENSIVE METABOLIC PANEL
ALT: 22
AST: 28
Alkaline Phosphatase: 42
CO2: 27
Calcium: 9
Chloride: 99
GFR calc Af Amer: >60
GFR calc non Af Amer: >60
Potassium: 4.8
Sodium: 134 — ABNORMAL LOW

## 2010-11-08 LAB — CBC
Hemoglobin: 13.4
RBC: 3.96 — ABNORMAL LOW
WBC: 8.3

## 2010-11-08 LAB — CK TOTAL AND CKMB (NOT AT ARMC)
CK, MB: 1.9
Total CK: 93

## 2010-11-08 LAB — BASIC METABOLIC PANEL
BUN: 8
CO2: 25
Calcium: 9.4
Creatinine, Ser: 0.99
Glucose, Bld: 150 — ABNORMAL HIGH

## 2010-11-08 LAB — DIFFERENTIAL
Eosinophils Absolute: 0.2
Eosinophils Relative: 3
Lymphs Abs: 2.3

## 2010-11-08 LAB — LIPASE, BLOOD: Lipase: 38

## 2010-11-08 LAB — POCT CARDIAC MARKERS: Troponin i, poc: <0.05

## 2010-11-27 LAB — POCT I-STAT 3, ART BLOOD GAS (G3+)
Bicarbonate: 22.7
TCO2: 24
pCO2 arterial: 38.4
pH, Arterial: 7.381

## 2010-11-27 LAB — POCT I-STAT 3, VENOUS BLOOD GAS (G3P V)
Bicarbonate: 21.3
O2 Saturation: 59
TCO2: 23
pCO2, Ven: 45.3
pCO2, Ven: 47.9
pH, Ven: 7.281
pH, Ven: 7.295
pO2, Ven: 34

## 2010-11-27 LAB — D-DIMER, QUANTITATIVE: D-Dimer, Quant: 0.31

## 2012-01-18 ENCOUNTER — Other Ambulatory Visit: Payer: Self-pay | Admitting: Internal Medicine

## 2012-01-18 DIAGNOSIS — R413 Other amnesia: Secondary | ICD-10-CM

## 2012-01-22 ENCOUNTER — Ambulatory Visit
Admission: RE | Admit: 2012-01-22 | Discharge: 2012-01-22 | Disposition: A | Discharge status: Home / Self Care | Payer: Medicare PPO | Source: Ambulatory Visit | Attending: Internal Medicine | Admitting: Internal Medicine

## 2012-01-22 DIAGNOSIS — R413 Other amnesia: Secondary | ICD-10-CM

## 2012-07-09 ENCOUNTER — Other Ambulatory Visit: Payer: Self-pay | Admitting: Physical Medicine and Rehabilitation

## 2012-07-09 DIAGNOSIS — M549 Dorsalgia, unspecified: Secondary | ICD-10-CM

## 2012-07-15 ENCOUNTER — Ambulatory Visit
Admission: RE | Admit: 2012-07-15 | Discharge: 2012-07-15 | Disposition: A | Discharge status: Home / Self Care | Payer: Medicare PPO | Source: Ambulatory Visit | Attending: Physical Medicine and Rehabilitation | Admitting: Physical Medicine and Rehabilitation

## 2012-07-15 VITALS — BP 170/78 | HR 58

## 2012-07-15 DIAGNOSIS — M549 Dorsalgia, unspecified: Secondary | ICD-10-CM

## 2012-07-15 MED ORDER — IOHEXOL 180 MG/ML  SOLN
17.0000 mL | Freq: Once | INTRAMUSCULAR | Status: AC | PRN
  Administered 2012-07-15: 17 mL via INTRATHECAL

## 2012-07-15 MED ORDER — DIAZEPAM 5 MG PO TABS
5.0000 mg | ORAL_TABLET | Freq: Once | ORAL | Status: AC
  Administered 2012-07-15: 5 mg via ORAL

## 2012-07-15 NOTE — Progress Notes (Signed)
Daughter in law states he has been off tramadol for the past 2 days. Discharge instructions explained to pt and daughter in law and she repeated them to make sure pt understood.

## 2012-10-06 ENCOUNTER — Other Ambulatory Visit: Payer: Self-pay | Admitting: *Deleted

## 2012-10-06 DIAGNOSIS — E785 Hyperlipidemia, unspecified: Secondary | ICD-10-CM

## 2012-10-06 DIAGNOSIS — E039 Hypothyroidism, unspecified: Secondary | ICD-10-CM

## 2012-10-06 DIAGNOSIS — E119 Type 2 diabetes mellitus without complications: Secondary | ICD-10-CM

## 2012-10-08 ENCOUNTER — Other Ambulatory Visit: Payer: Self-pay | Admitting: Endocrinology

## 2012-10-08 DIAGNOSIS — E119 Type 2 diabetes mellitus without complications: Secondary | ICD-10-CM

## 2012-10-09 ENCOUNTER — Other Ambulatory Visit (INDEPENDENT_AMBULATORY_CARE_PROVIDER_SITE_OTHER): Payer: Medicare PPO

## 2012-10-09 DIAGNOSIS — E039 Hypothyroidism, unspecified: Secondary | ICD-10-CM

## 2012-10-09 DIAGNOSIS — E119 Type 2 diabetes mellitus without complications: Secondary | ICD-10-CM

## 2012-10-09 DIAGNOSIS — E785 Hyperlipidemia, unspecified: Secondary | ICD-10-CM

## 2012-10-09 LAB — URINALYSIS
Hgb urine dipstick: NEGATIVE
Ketones, ur: NEGATIVE
Leukocytes, UA: NEGATIVE
Specific Gravity, Urine: 1.01 (ref 1.000–1.030)
Urine Glucose: NEGATIVE
Urobilinogen, UA: 0.2 (ref 0.0–1.0)

## 2012-10-09 LAB — HEMOGLOBIN A1C: Hgb A1c MFr Bld: 6.6 % — ABNORMAL HIGH (ref 4.6–6.5)

## 2012-10-10 LAB — COMPREHENSIVE METABOLIC PANEL
ALT: 16 U/L (ref 0–53)
AST: 28 U/L (ref 0–37)
Creatinine, Ser: 1.2 mg/dL (ref 0.4–1.5)
Sodium: 139 mEq/L (ref 135–145)
Total Bilirubin: 1.1 mg/dL (ref 0.3–1.2)
Total Protein: 7.3 g/dL (ref 6.0–8.3)

## 2012-10-10 LAB — LIPID PANEL
HDL: 46.5 mg/dL (ref >39.00)
LDL Cholesterol: 68 mg/dL (ref 0–99)
Total CHOL/HDL Ratio: 3
Triglycerides: 103 mg/dL (ref 0.0–149.0)

## 2012-10-10 LAB — MICROALBUMIN / CREATININE URINE RATIO
Creatinine,U: 67.8 mg/dL
Microalb, Ur: 0.7 mg/dL (ref 0.0–1.9)

## 2012-10-10 LAB — TSH: TSH: 3.86 u[IU]/mL (ref 0.35–5.50)

## 2012-10-16 ENCOUNTER — Encounter: Payer: Self-pay | Admitting: Endocrinology

## 2012-10-16 ENCOUNTER — Ambulatory Visit (INDEPENDENT_AMBULATORY_CARE_PROVIDER_SITE_OTHER): Payer: Medicare PPO | Admitting: Endocrinology

## 2012-10-16 VITALS — BP 122/58 | HR 81 | Temp 98.3°F | Resp 10 | Ht 68.0 in | Wt 186.1 lb

## 2012-10-16 DIAGNOSIS — E039 Hypothyroidism, unspecified: Secondary | ICD-10-CM

## 2012-10-16 DIAGNOSIS — E785 Hyperlipidemia, unspecified: Secondary | ICD-10-CM

## 2012-10-16 DIAGNOSIS — E119 Type 2 diabetes mellitus without complications: Secondary | ICD-10-CM

## 2012-10-16 MED ORDER — ONETOUCH ULTRA SYSTEM W/DEVICE KIT: 1.0000 | PACK | Freq: Once | Status: AC

## 2012-10-16 NOTE — Progress Notes (Signed)
Patient ID: Logan Rush, male   DOB: [birth year], 79 y.o.   MRN: [id number]  Logan Rush is an 79 y.o. male.   Reason for Appointment: Diabetes follow-up   History of Present Illness   Diagnosis: Type 2 DIABETES MELITUS  He generally has had mild diabetes and has had tendency to high postprandial readings Previously has been on various regimens including glyburide, Amaryl and metformin He is usually inconsistent with his medication regimen despite instructions and uses his own program  On his last visit he was having relatively high postprandial readings and was told to check these more regularly but he has not done so Also was supposed to take Precose before every meal but he is taking this only after breakfast now He does have only one reading after supper which was 281 but has to readings after lunch which are 126 and 161     Oral hypoglycemic drugs: Does not have his list with him, possibly taking Amaryl and Precose        Side effects from medications: None Proper timing of medications in relation to meals:  not taking the Precose before the meal          Monitors blood glucose: Once a day.    Glucometer: One Touch.          Blood Glucose readings from meter download: readings before breakfast: Recently 84-116 but has one reading of 198  Hypoglycemia frequency: Never.          Meals: 3 meals per day.          Physical activity: exercise: Usually trying to walk daily            Previous A1c record not available  Wt Readings from Last 3 Encounters:  10/16/12 186 lb 1.6 oz (84.414 kg)  06/18/07 182 lb 6.1 oz (82.728 kg)  09/02/06 187 lb 6.4 oz (85.004 kg)    No visits with results within 1 Week(s) from this visit. Latest known visit with results is:  Appointment on 10/09/2012  Component Date Value Range Status  . Free T4 10/09/2012 1.10  0.60 - 1.60 ng/dL Final  . TSH 62/95/2841 3.86  0.35 - 5.50 uIU/mL Final  . Hemoglobin A1C 10/09/2012 6.6* 4.6 - 6.5 % Final    Glycemic Control Guidelines for People with Diabetes:Non Diabetic:  <6%Goal of Therapy: <7%Additional Action Suggested:  >8%   . Color, Urine 10/09/2012 LT. YELLOW  Yellow;Lt. Yellow Final  . APPearance 10/09/2012 CLEAR  Clear Final  . Specific Gravity, Urine 10/09/2012 1.010  1.000-1.030 Final  . pH 10/09/2012 7.0  5.0 - 8.0 Final  . Total Protein, Urine 10/09/2012 NEGATIVE  Negative Final  . Urine Glucose 10/09/2012 NEGATIVE  Negative Final  . Ketones, ur 10/09/2012 NEGATIVE  Negative Final  . Bilirubin Urine 10/09/2012 NEGATIVE  Negative Final  . Hgb urine dipstick 10/09/2012 NEGATIVE  Negative Final  . Urobilinogen, UA 10/09/2012 0.2  0.0 - 1.0 Final  . Leukocytes, UA 10/09/2012 NEGATIVE  Negative Final  . Nitrite 10/09/2012 NEGATIVE  Negative Final  . Microalb, Ur 10/09/2012 0.7  0.0 - 1.9 mg/dL Final  . Creatinine,U 32/44/0102 67.8   Final  . Microalb Creat Ratio 10/09/2012 1.0  0.0 - 30.0 mg/g Final  . Cholesterol 10/09/2012 135  0 - 200 mg/dL Final   ATP III Classification       Desirable:  < 200 mg/dL  Borderline High:  200 - 239 mg/dL          High:  > = 161 mg/dL  . Triglycerides 10/09/2012 103.0  0.0 - 149.0 mg/dL Final   Normal:  <096 mg/dLBorderline High:  150 - 199 mg/dL  . HDL 10/09/2012 46.50  >39.00 mg/dL Final  . VLDL 04/54/0981 20.6  0.0 - 40.0 mg/dL Final  . LDL Cholesterol 10/09/2012 68  0 - 99 mg/dL Final  . Total CHOL/HDL Ratio 10/09/2012 3   Final                  Men          Women1/2 Average Risk     3.4          3.3Average Risk          5.0          4.42X Average Risk          9.6          7.13X Average Risk          15.0          11.0                      . Sodium 10/09/2012 139  135 - 145 mEq/L Final  . Potassium 10/09/2012 4.5  3.5 - 5.1 mEq/L Final  . Chloride 10/09/2012 105  96 - 112 mEq/L Final  . CO2 10/09/2012 28  19 - 32 mEq/L Final  . Glucose, Bld 10/09/2012 94  70 - 99 mg/dL Final  . BUN 19/14/7829 11  6 - 23 mg/dL Final  .  Creatinine, Ser 10/09/2012 1.2  0.4 - 1.5 mg/dL Final  . Total Bilirubin 10/09/2012 1.1  0.3 - 1.2 mg/dL Final  . Alkaline Phosphatase 10/09/2012 48  39 - 117 U/L Final  . AST 10/09/2012 28  0 - 37 U/L Final  . ALT 10/09/2012 16  0 - 53 U/L Final  . Total Protein 10/09/2012 7.3  6.0 - 8.3 g/dL Final  . Albumin 56/21/3086 4.2  3.5 - 5.2 g/dL Final  . Calcium 57/84/6962 9.9  8.4 - 10.5 mg/dL Final  . GFR 95/28/4132 64.37  >60.00 mL/min Final      Medication List       This list is accurate as of: 10/16/12  2:10 PM.  Always use your most recent med list.               ALPRAZolam 0.5 MG tablet  Commonly known as:  XANAX  Take 0.5 mg by mouth at bedtime as needed for sleep.     aspirin 81 MG tablet  Take 81 mg by mouth daily.     atorvastatin 10 MG tablet  Commonly known as:  LIPITOR  Take 10 mg by mouth daily.     levothyroxine 112 MCG tablet  Commonly known as:  SYNTHROID, LEVOTHROID  112 mcg.     nebivolol 2.5 MG tablet  Commonly known as:  BYSTOLIC  Take 2.5 mg by mouth daily.     ONE TOUCH ULTRA TEST test strip  Generic drug:  glucose blood  Checks 3 times per week        Allergies: No Known Allergies  No past medical history on file.  No past surgical history on file.  No family history on file.  Social History:  reports that he has never smoked. He has never used smokeless tobacco. His alcohol and  drug histories are not on file.  Review of Systems:  HYPERTENSION:  Home blood pressure: 158 systolic but does not keep a record  HYPERLIPIDEMIA: The lipid abnormality consists of elevated LDL and is being treated with Lipitor 10 mg, LDL 68.    He has had long history of hypothyroidism which appears to be well controlled with current regimen of 112 mcg levothyroxine   Examination:   BP 122/58  Pulse 81  Temp(Src) 98.3 F (36.8 C)  Resp 10  Ht 5\' 8"  (1.727 m)  Wt 186 lb 1.6 oz (84.414 kg)  BMI 28.3 kg/m2  SpO2 98%  Body mass index is 28.3 kg/(m^2).    ASSESSMENT/ PLAN::   Diabetes type 2   The patient's diabetes control appears to be fair with A1c 6.6 but has at least one documented high postprandial reading He will call to confirm what medications he is taking Meanwhile will need to have him take at least half a tablet of Precose as before breakfast and dinner and may stop it in the morning   HYPERTENSION: Blood pressure is excellent and he will continue Bystolic low dose, currently no evidence of microalbuminuria  Hilari Wethington 10/16/2012, 2:10 PM

## 2012-10-16 NOTE — Patient Instructions (Addendum)
Acarbose 1/2 at start of breakfast and dinner;   Please check blood sugars at least half the time about 2 hours after any meal and as directed on waking up. Please bring blood sugar monitor to each visit

## 2012-10-30 ENCOUNTER — Other Ambulatory Visit: Payer: Self-pay | Admitting: *Deleted

## 2012-10-30 MED ORDER — LEVOTHYROXINE SODIUM 112 MCG PO TABS: 112.0000 ug | ORAL_TABLET | Freq: Every day | ORAL | Status: AC

## 2013-03-09 ENCOUNTER — Other Ambulatory Visit: Payer: Medicare PPO

## 2013-03-12 ENCOUNTER — Other Ambulatory Visit (INDEPENDENT_AMBULATORY_CARE_PROVIDER_SITE_OTHER): Payer: Medicare PPO

## 2013-03-12 DIAGNOSIS — E119 Type 2 diabetes mellitus without complications: Secondary | ICD-10-CM

## 2013-03-12 DIAGNOSIS — E039 Hypothyroidism, unspecified: Secondary | ICD-10-CM

## 2013-03-12 LAB — BASIC METABOLIC PANEL
BUN: 14 mg/dL (ref 6–23)
CALCIUM: 9.4 mg/dL (ref 8.4–10.5)
CO2: 25 meq/L (ref 19–32)
CREATININE: 1.2 mg/dL (ref 0.4–1.5)
Chloride: 107 mEq/L (ref 96–112)
GFR: 61.81 mL/min (ref >60.00)
GLUCOSE: 60 mg/dL — AB (ref 70–99)
Potassium: 4.1 mEq/L (ref 3.5–5.1)
Sodium: 140 mEq/L (ref 135–145)

## 2013-03-12 LAB — T4, FREE: Free T4: 0.87 ng/dL (ref 0.60–1.60)

## 2013-03-12 LAB — TSH: TSH: 2.42 u[IU]/mL (ref 0.35–5.50)

## 2013-03-12 LAB — HEMOGLOBIN A1C: Hgb A1c MFr Bld: 6 % (ref 4.6–6.5)

## 2013-03-13 ENCOUNTER — Ambulatory Visit: Payer: Medicare PPO | Admitting: Endocrinology

## 2013-03-16 LAB — IFOBT (OCCULT BLOOD): IMMUNOLOGICAL FECAL OCCULT BLOOD TEST: NEGATIVE

## 2013-03-19 ENCOUNTER — Ambulatory Visit: Payer: Medicare PPO | Admitting: Endocrinology

## 2013-04-01 ENCOUNTER — Ambulatory Visit: Payer: Medicare PPO | Admitting: Endocrinology

## 2013-04-15 ENCOUNTER — Ambulatory Visit (INDEPENDENT_AMBULATORY_CARE_PROVIDER_SITE_OTHER): Payer: Medicare PPO | Admitting: Endocrinology

## 2013-04-15 ENCOUNTER — Encounter: Payer: Self-pay | Admitting: Endocrinology

## 2013-04-15 ENCOUNTER — Other Ambulatory Visit: Payer: Self-pay | Admitting: *Deleted

## 2013-04-15 VITALS — BP 136/62 | HR 83 | Temp 98.0°F | Resp 16 | Ht 68.0 in | Wt 188.0 lb

## 2013-04-15 DIAGNOSIS — E039 Hypothyroidism, unspecified: Secondary | ICD-10-CM

## 2013-04-15 DIAGNOSIS — E785 Hyperlipidemia, unspecified: Secondary | ICD-10-CM

## 2013-04-15 DIAGNOSIS — E119 Type 2 diabetes mellitus without complications: Secondary | ICD-10-CM

## 2013-04-15 DIAGNOSIS — I10 Essential (primary) hypertension: Secondary | ICD-10-CM

## 2013-04-15 MED ORDER — ACARBOSE 25 MG PO TABS: 25.0000 mg | ORAL_TABLET | Freq: Every day | ORAL | Status: AC

## 2013-04-15 MED ORDER — ATORVASTATIN CALCIUM 10 MG PO TABS: 10.0000 mg | ORAL_TABLET | Freq: Every day | ORAL | Status: DC

## 2013-04-15 NOTE — Progress Notes (Signed)
Patient ID: Logan Rush, male   DOB: 11/03/1926, 80 y.o.   MRN: [id number]   Reason for Appointment: Diabetes follow-up   History of Present Illness   Diagnosis: Type 2 DIABETES MELITUS  He generally has had mild diabetes and has had tendency to high postprandial readings Previously has been on various regimens including glyburide, Amaryl and metformin He is usually inconsistent with his medication regimen despite instructions and uses his medications as needed and inconsistently  Previously was having relatively high postprandial readings and was told to check these more regularly but he has not done so Appears relatively higher readings on lunchtime and has only one reading after dinner Also was supposed to take Precose before every meal but he does not have this He is using an unknown combination of medications from [place] which he takes a couple of times a week Has had a couple of episodes of blood sugars in the 60s in the morning Has gained a little weight However his A1c is relatively good     Oral hypoglycemic drugs: Does not have the name available today        Side effects from medications: None Proper timing of medications in relation to meals:  not taking the medication regularly         Monitors blood glucose: Once a day.    Glucometer: One Touch.          Blood Glucose readings from meter download: readings before breakfast: 61-133, lunchtime 181, 161, at bedtime 148 Hypoglycemia frequency:  rare, lowest reading 61 last month.          Meals: 3 meals per day.          Physical activity: exercise: Usually trying to walk as much as possible             Wt Readings from Last 3 Encounters:  04/15/13 188 lb (85.276 kg)  10/16/12 186 lb 1.6 oz (84.414 kg)  06/18/07 182 lb 6.1 oz (82.728 kg)   Lab Results  Component Value Date   HGBA1C 6.0 03/12/2013   HGBA1C 6.6* 10/09/2012   HGBA1C  Value: 6.2 (NOTE) The ADA recommends the following therapeutic goal for glycemic  control related to Hgb A1c measurement: Goal of therapy: <6.5 Hgb A1c  Reference: American Diabetes Association: Clinical Practice Recommendations 2010, Diabetes Care, 2010, 33: (Suppl  1).* 11/29/2008   Lab Results  Component Value Date   MICROALBUR 0.7 10/09/2012   LDLCALC 68 10/09/2012   CREATININE 1.2 03/12/2013    No visits with results within 1 Week(s) from this visit. Latest known visit with results is:  Appointment on 03/12/2013  Component Date Value Ref Range Status  . Sodium 03/12/2013 140  135 - 145 mEq/L Final  . Potassium 03/12/2013 4.1  3.5 - 5.1 mEq/L Final  . Chloride 03/12/2013 107  96 - 112 mEq/L Final  . CO2 03/12/2013 25  19 - 32 mEq/L Final  . Glucose, Bld 03/12/2013 60* 70 - 99 mg/dL Final  . BUN 03/12/2013 14  6 - 23 mg/dL Final  . Creatinine, Ser 03/12/2013 1.2  0.4 - 1.5 mg/dL Final  . Calcium 03/12/2013 9.4  8.4 - 10.5 mg/dL Final  . GFR 03/12/2013 61.81  >60.00 mL/min Final  . Hemoglobin A1C 03/12/2013 6.0  4.6 - 6.5 % Final   Glycemic Control Guidelines for People with Diabetes:Non Diabetic:  <6%Goal of Therapy: <7%Additional Action Suggested:  >8%   . TSH 03/12/2013 2.42  0.35 - 5.50 uIU/mL  Final  . Free T4 03/12/2013 0.87  0.60 - 1.60 ng/dL Final      Medication List       This list is accurate as of: 04/15/13  1:34 PM.  Always use your most recent med list.               aspirin 81 MG tablet  Take 81 mg by mouth daily.     atorvastatin 10 MG tablet  Commonly known as:  LIPITOR  Take 1 tablet (10 mg total) by mouth daily.     levothyroxine 112 MCG tablet  Commonly known as:  SYNTHROID, LEVOTHROID  Take 1 tablet (112 mcg total) by mouth daily before breakfast.     nebivolol 2.5 MG tablet  Commonly known as:  BYSTOLIC  Take 2.5 mg by mouth daily.     ONE TOUCH ULTRA SYSTEM KIT W/DEVICE Kit  1 kit by Does not apply route once.     ONE TOUCH ULTRA TEST test strip  Generic drug:  glucose blood  Checks 3 times per week         Allergies: No Known Allergies  No past medical history on file.  No past surgical history on file.  No family history on file.  Social History:  reports that he has never smoked. He has never used smokeless tobacco. His alcohol and drug histories are not on file.  Review of Systems:  HYPERTENSION:  Home blood pressure: Not keeping a record. Taking unknown dosage   HYPERLIPIDEMIA: The lipid abnormality consists of elevated LDL and is being treated with Lipitor 10 mg, LDL has been at target    He has had long history of hypothyroidism which appears to be well controlled with current regimen of 112 mcg levothyroxine  Lab Results  Component Value Date   TSH 2.42 03/12/2013     Examination:   BP 136/62  Pulse 83  Temp(Src) 98 F (36.7 C)  Resp 16  Ht $R'5\' 8"'Iq$  (1.727 m)  Wt 188 lb (85.276 kg)  BMI 28.59 kg/m2  SpO2 97%  Body mass index is 28.59 kg/(m^2).   ASSESSMENT/ PLAN::   Diabetes type 2   The patient's diabetes control appears to be good overall with A1c 6.0 but has mostly high readings after meals probably after eating his oatmeal in the morning Discussed again the need to check more readings after meals and only sporadically on waking up He will call to confirm what medications he is taking Meanwhile will need to have him take 25 mg Precose before breakfast, discussed timing of the medication  HYPERTENSION: Blood pressure is excellent and he will continue Bystolic low dose, previously no evidence of microalbuminuria  Hypothyroidism: Adequately supplemented  We'll get records of his recent evaluation from PCP  [facility] 04/15/2013, 1:34 PM

## 2013-04-15 NOTE — Patient Instructions (Signed)
Please check blood sugars at least half the time about 2 hours after any meal and as directed on waking up. Please bring blood sugar monitor to each visit  Take Acarbose before breakfast at start of meal

## 2013-06-11 ENCOUNTER — Other Ambulatory Visit: Payer: Self-pay | Admitting: *Deleted

## 2013-06-11 MED ORDER — GLUCOSE BLOOD VI STRP: ORAL_STRIP | Status: DC

## 2013-07-24 ENCOUNTER — Other Ambulatory Visit: Payer: Self-pay | Admitting: *Deleted

## 2013-07-24 MED ORDER — ONETOUCH DELICA LANCETS 33G MISC: Status: DC

## 2013-08-18 ENCOUNTER — Telehealth: Payer: Self-pay | Admitting: Endocrinology

## 2013-08-18 ENCOUNTER — Other Ambulatory Visit: Payer: Self-pay | Admitting: *Deleted

## 2013-08-18 MED ORDER — GLUCOSE BLOOD VI STRP: ORAL_STRIP | Status: DC

## 2013-08-18 NOTE — Telephone Encounter (Signed)
rx sent, daughter is aware

## 2013-08-18 NOTE — Telephone Encounter (Signed)
[phone] is his daughter please call her once this is done

## 2013-08-18 NOTE — Telephone Encounter (Signed)
Test strips need to be called in for the pt he is leaving [place] of [place] please submit the rx for the 2 times daily testing

## 2013-08-19 ENCOUNTER — Telehealth: Payer: Self-pay | Admitting: Endocrinology

## 2013-08-19 ENCOUNTER — Other Ambulatory Visit: Payer: Self-pay | Admitting: *Deleted

## 2013-08-19 MED ORDER — GLUCOSE BLOOD VI STRP: ORAL_STRIP | Status: DC

## 2013-08-19 NOTE — Telephone Encounter (Signed)
Please check the test strip RX pharm states it is wrong - pt states he only checks 2 times a day

## 2013-12-22 ENCOUNTER — Ambulatory Visit: Payer: Medicare PPO | Attending: Physical Therapy | Admitting: Physical Therapy

## 2013-12-22 DIAGNOSIS — M5415 Radiculopathy, thoracolumbar region: Secondary | ICD-10-CM | POA: Insufficient documentation

## 2013-12-22 DIAGNOSIS — M545 Low back pain: Secondary | ICD-10-CM | POA: Diagnosis not present

## 2013-12-22 DIAGNOSIS — Z5189 Encounter for other specified aftercare: Secondary | ICD-10-CM | POA: Insufficient documentation

## 2013-12-25 ENCOUNTER — Other Ambulatory Visit: Payer: Self-pay | Admitting: *Deleted

## 2013-12-25 ENCOUNTER — Ambulatory Visit (INDEPENDENT_AMBULATORY_CARE_PROVIDER_SITE_OTHER): Payer: Medicare PPO | Admitting: Endocrinology

## 2013-12-25 ENCOUNTER — Encounter: Payer: Self-pay | Admitting: Endocrinology

## 2013-12-25 VITALS — BP 148/78 | HR 57 | Temp 98.3°F | Resp 14 | Ht 68.0 in | Wt 186.4 lb

## 2013-12-25 DIAGNOSIS — E119 Type 2 diabetes mellitus without complications: Secondary | ICD-10-CM

## 2013-12-25 DIAGNOSIS — Z23 Encounter for immunization: Secondary | ICD-10-CM

## 2013-12-25 DIAGNOSIS — E785 Hyperlipidemia, unspecified: Secondary | ICD-10-CM

## 2013-12-25 DIAGNOSIS — E063 Autoimmune thyroiditis: Secondary | ICD-10-CM

## 2013-12-25 DIAGNOSIS — I10 Essential (primary) hypertension: Secondary | ICD-10-CM

## 2013-12-25 DIAGNOSIS — E038 Other specified hypothyroidism: Secondary | ICD-10-CM

## 2013-12-25 LAB — MICROALBUMIN / CREATININE URINE RATIO
CREATININE, U: 22.5 mg/dL
MICROALB/CREAT RATIO: 10.7 mg/g (ref 0.0–30.0)
Microalb, Ur: 2.4 mg/dL — ABNORMAL HIGH (ref 0.0–1.9)

## 2013-12-25 MED ORDER — GLUCOSE BLOOD VI STRP: ORAL_STRIP | Status: DC

## 2013-12-25 MED ORDER — LOSARTAN POTASSIUM 50 MG PO TABS: 50.0000 mg | ORAL_TABLET | Freq: Every day | ORAL | Status: AC

## 2013-12-25 MED ORDER — NEBIVOLOL HCL 2.5 MG PO TABS: 2.5000 mg | ORAL_TABLET | Freq: Every day | ORAL | Status: AC

## 2013-12-25 NOTE — Progress Notes (Signed)
Patient ID: Logan Rush, male   DOB: [birth year], 80 y.o.   MRN: [id number]   Reason for Appointment:  follow-up of multiple issues  History of Present Illness   Diagnosis: Type 2 DIABETES MELITUS  He has had mild diabetes with tendency to high postprandial readings Previously has been on various regimens including glyburide, Amaryl and metformin He is usually inconsistent with his medication regimen despite instructions and uses his medications as he feels like despite instructions on how to take them  Since he has had relatively high postprandial readings he has been told to check these more regularly but he has not done so Only checking fasting blood sugars recently; on his last visit he was having relatively high readings after lunch, highest 181  Also was instructed to take Precose but is not taking this He is using an unknown combination of medications from [place], likely low dose Amaryl/Actos and metformin   He does not think he has had symptoms of hypoglycemia and does not have any low sugars in the mornings He is overall fairly compliant with his exercise regimen  Has lost a little weight Not clear if he had an A1c done by his PCP recently     Oral hypoglycemic drugs:  ? Amaryl. Actos, metformin, 1/2 daily        Side effects from medications: None Proper timing of medications in relation to meals:  not taking the medication regularly         Monitors blood glucose: Once a day.    Glucometer: One Touch.          Blood Glucose readings from meter download:  Fasting readings 77-189 with median 104 Bedtime 160 with only one reading   Meals: 3 meals per day.          Physical activity: exercise: Usually trying to walk 30-45 min Daily, weather permitting            Wt Readings from Last 3 Encounters:  12/25/13 186 lb 6.4 oz (84.55 kg)  04/15/13 188 lb (85.276 kg)  10/16/12 186 lb 1.6 oz (84.414 kg)   Lab Results  Component Value Date   HGBA1C 6.0  03/12/2013   HGBA1C 6.6* 10/09/2012   HGBA1C * 11/29/2008    6.2 (NOTE) The ADA recommends the following therapeutic goal for glycemic control related to Hgb A1c measurement: Goal of therapy: <6.5 Hgb A1c  Reference: American Diabetes Association: Clinical Practice Recommendations 2010, Diabetes Care, 2010, 33: (Suppl  1).   Lab Results  Component Value Date   MICROALBUR 0.7 10/09/2012   LDLCALC 68 10/09/2012   CREATININE 1.2 03/12/2013    No visits with results within 1 Week(s) from this visit. Latest known visit with results is:  Scanned Document on 06/03/2013  Component Date Value Ref Range Status  . IFOBT 03/16/2013 Negative   Final      Medication List       This list is accurate as of: 12/25/13  1:43 PM.  Always use your most recent med list.               acarbose 25 MG tablet  Commonly known as:  PRECOSE  Take 1 tablet (25 mg total) by mouth daily before breakfast.     aspirin 81 MG tablet  Take 81 mg by mouth daily.     atorvastatin 10 MG tablet  Commonly known as:  LIPITOR  Take 1 tablet (10 mg total) by mouth daily.  glucose blood test strip  Commonly known as:  ONE TOUCH ULTRA TEST  Use to check blood sugar 2 times per week dx code 250.00     levothyroxine 112 MCG tablet  Commonly known as:  SYNTHROID, LEVOTHROID  Take 1 tablet (112 mcg total) by mouth daily before breakfast.     BYSTOLIC 5 MG tablet  Generic drug:  nebivolol     nebivolol 2.5 MG tablet  Commonly known as:  BYSTOLIC  Take 2.5 mg by mouth daily.     ONE TOUCH ULTRA SYSTEM KIT W/DEVICE Kit  1 kit by Does not apply route once.     ONETOUCH DELICA LANCETS 33G Misc  Use to check blood sugar twice daily dx code 250.00        Allergies: No Known Allergies  No past medical history on file.  No past surgical history on file.  No family history on file.  Social History:  reports that he has never smoked. He has never used smokeless tobacco. His alcohol and drug histories  are not on file.  Review of Systems:  HYPERTENSION:  Home blood pressure: Not checking regularly at home and thinks that his blood pressure may be high at times.  Also it was mildly increased with his PCP last month Currently taking Bystolic 5 mg daily  HYPERLIPIDEMIA: The lipid abnormality consists of elevated LDL and is being treated with Lipitor 10 mg, LDL has been at target previously  Lab Results  Component Value Date   CHOL 135 10/09/2012   HDL 46.50 10/09/2012   LDLCALC 68 10/09/2012   TRIG 103.0 10/09/2012   CHOLHDL 3 10/09/2012       He has had long history of hypothyroidism which appears to be well controlled with current regimen of 112 mcg levothyroxine  Takes 7 1/2 pills per week, The dose was increased in 10/15 by PCP when TSH was 4.7 but he does not feel any different   Lab Results  Component Value Date   TSH 2.42 03/12/2013   No symptoms of numbness or tingling in his feet No difficulties with balance although when he first gets up he is stiff especially in his back.  May sometimes use a cane to walk   Examination:   BP 148/78 mmHg  Pulse 57  Temp(Src) 98.3 F (36.8 C)  Resp 14  Ht 5\' 8"  (1.727 m)  Wt 186 lb 6.4 oz (84.55 kg)  BMI 28.35 kg/m2  SpO2 97%  Body mass index is 28.35 kg/(m^2).   Repeat blood pressure 180/74 on the left side and 168/72 on the right side Heart rate shows a few ectopics No pedal edema  Diabetic foot exam shows normal monofilament sensation in the toes and plantar surfaces, no skin lesions or ulcers on the feet Has normal pedal pulses on the left and absent on the right  ASSESSMENT/ PLAN:   Diabetes type 2 with mild obesity  The patient's diabetes control appears to be good overall with Normal fasting blood sugars but he has not done any readings after meals again No recent A1c available and not clear if it was checked by PCP He usually likes to take medications on his own and has refused to take Precose despite  reminders He is taking a combination of unknown doses of Amaryl/metformin/Actos in a single tablet No hypoglycemia with this and can continue as long as his A1c is fairly good Discussed again the need to check more readings after meals and only occasionally on  waking up Also emphasized the need for balanced meals with limited carbohydrates and enough protein at each meal  HYPERTENSION: Blood pressure is higher than usual and he is having mild bradycardia from Bystolic 5 mg Since he previously had mild microalbuminuria will have him add 50 mg of losartan and reduce Bystolic to 2.5 mg Will check microalbumin also today  Hypothyroidism: Minimally increased TSH and has had dosage adjustment done by PCP about a month ago Will repeat his levels on follow-up  Hyperlipidemia: Needs follow-up levels  Counseling time over 50% of today's 25 minute visit   Celeste Candelas 12/25/2013, 1:43 PM   Addendum: A1c will be checked in [redacted] weeks along with chemistry and lipids

## 2013-12-25 NOTE — Patient Instructions (Addendum)
Please check blood sugars at least half the time about 2 hours after any meal and times per week on waking up. Please bring blood sugar monitor to each visit  Bystolic 1/2 tab  Start Losartan 50mg  daily

## 2014-04-12 ENCOUNTER — Emergency Department (HOSPITAL_COMMUNITY)
Admission: EM | Admit: 2014-04-12 | Discharge: 2014-04-12 | Disposition: A | Discharge status: Home / Self Care | Payer: Medicare PPO | Attending: Emergency Medicine | Admitting: Emergency Medicine

## 2014-04-12 ENCOUNTER — Emergency Department (HOSPITAL_COMMUNITY): Payer: Medicare PPO

## 2014-04-12 ENCOUNTER — Encounter (HOSPITAL_COMMUNITY): Payer: Self-pay | Admitting: Emergency Medicine

## 2014-04-12 DIAGNOSIS — Z7982 Long term (current) use of aspirin: Secondary | ICD-10-CM | POA: Insufficient documentation

## 2014-04-12 DIAGNOSIS — R1013 Epigastric pain: Secondary | ICD-10-CM | POA: Insufficient documentation

## 2014-04-12 DIAGNOSIS — E780 Pure hypercholesterolemia: Secondary | ICD-10-CM | POA: Insufficient documentation

## 2014-04-12 DIAGNOSIS — R1032 Left lower quadrant pain: Secondary | ICD-10-CM | POA: Insufficient documentation

## 2014-04-12 DIAGNOSIS — E039 Hypothyroidism, unspecified: Secondary | ICD-10-CM | POA: Diagnosis not present

## 2014-04-12 DIAGNOSIS — I10 Essential (primary) hypertension: Secondary | ICD-10-CM | POA: Insufficient documentation

## 2014-04-12 DIAGNOSIS — R109 Unspecified abdominal pain: Secondary | ICD-10-CM | POA: Diagnosis present

## 2014-04-12 DIAGNOSIS — R1012 Left upper quadrant pain: Secondary | ICD-10-CM | POA: Insufficient documentation

## 2014-04-12 DIAGNOSIS — Z79899 Other long term (current) drug therapy: Secondary | ICD-10-CM | POA: Insufficient documentation

## 2014-04-12 HISTORY — DX: Other disorders of electrolyte and fluid balance, not elsewhere classified: E87.8

## 2014-04-12 HISTORY — DX: Essential (primary) hypertension: I10

## 2014-04-12 HISTORY — DX: Hypothyroidism, unspecified: E03.9

## 2014-04-12 LAB — HEPATIC FUNCTION PANEL
ALK PHOS: 41 U/L (ref 39–117)
ALT: 16 U/L (ref 0–53)
AST: 26 U/L (ref 0–37)
Albumin: 3.9 g/dL (ref 3.5–5.2)
BILIRUBIN INDIRECT: 0.8 mg/dL (ref 0.3–0.9)
Bilirubin, Direct: 0.2 mg/dL (ref 0.0–0.5)
Total Bilirubin: 1 mg/dL (ref 0.3–1.2)
Total Protein: 7.7 g/dL (ref 6.0–8.3)

## 2014-04-12 LAB — URINALYSIS, ROUTINE W REFLEX MICROSCOPIC
Bilirubin Urine: NEGATIVE
GLUCOSE, UA: NEGATIVE mg/dL
HGB URINE DIPSTICK: NEGATIVE
Ketones, ur: NEGATIVE mg/dL
Leukocytes, UA: NEGATIVE
Nitrite: NEGATIVE
PROTEIN: NEGATIVE mg/dL
Specific Gravity, Urine: 1.023 (ref 1.005–1.030)
Urobilinogen, UA: 0.2 mg/dL (ref 0.0–1.0)
pH: 6 (ref 5.0–8.0)

## 2014-04-12 LAB — BASIC METABOLIC PANEL
Anion gap: 8 (ref 5–15)
BUN: 13 mg/dL (ref 6–23)
CO2: 26 mmol/L (ref 19–32)
Calcium: 9.6 mg/dL (ref 8.4–10.5)
Chloride: 102 mmol/L (ref 96–112)
Creatinine, Ser: 1.29 mg/dL (ref 0.50–1.35)
GFR, EST AFRICAN AMERICAN: 57 mL/min — AB (ref >90)
GFR, EST NON AFRICAN AMERICAN: 49 mL/min — AB (ref >90)
GLUCOSE: 239 mg/dL — AB (ref 70–99)
POTASSIUM: 4.3 mmol/L (ref 3.5–5.1)
Sodium: 136 mmol/L (ref 135–145)

## 2014-04-12 LAB — CBC WITH DIFFERENTIAL/PLATELET
BASOS ABS: 0 10*3/uL (ref 0.0–0.1)
BASOS PCT: 1 % (ref 0–1)
Eosinophils Absolute: 0.2 10*3/uL (ref 0.0–0.7)
Eosinophils Relative: 3 % (ref 0–5)
HCT: 38.4 % — ABNORMAL LOW (ref 39.0–52.0)
HEMOGLOBIN: 13.5 g/dL (ref 13.0–17.0)
LYMPHS PCT: 35 % (ref 12–46)
Lymphs Abs: 1.8 10*3/uL (ref 0.7–4.0)
MCH: 32.2 pg (ref 26.0–34.0)
MCHC: 35.2 g/dL (ref 30.0–36.0)
MCV: 91.6 fL (ref 78.0–100.0)
Monocytes Absolute: 0.4 10*3/uL (ref 0.1–1.0)
Monocytes Relative: 8 % (ref 3–12)
NEUTROS ABS: 2.8 10*3/uL (ref 1.7–7.7)
Neutrophils Relative %: 53 % (ref 43–77)
Platelets: 213 10*3/uL (ref 150–400)
RBC: 4.19 MIL/uL — AB (ref 4.22–5.81)
RDW: 12.6 % (ref 11.5–15.5)
WBC: 5.2 10*3/uL (ref 4.0–10.5)

## 2014-04-12 LAB — I-STAT CG4 LACTIC ACID, ED: Lactic Acid, Venous: 1.31 mmol/L (ref 0.5–2.0)

## 2014-04-12 LAB — LIPASE, BLOOD: Lipase: 43 U/L (ref 11–59)

## 2014-04-12 MED ORDER — IOHEXOL 300 MG/ML  SOLN
100.0000 mL | Freq: Once | INTRAMUSCULAR | Status: AC | PRN
  Administered 2014-04-12: 80 mL via INTRAVENOUS

## 2014-04-12 MED ORDER — IOHEXOL 300 MG/ML  SOLN
25.0000 mL | INTRAMUSCULAR | Status: AC
  Administered 2014-04-12: 25 mL via ORAL

## 2014-04-12 MED ORDER — SODIUM CHLORIDE 0.9 % IV BOLUS (SEPSIS)
500.0000 mL | Freq: Once | INTRAVENOUS | Status: AC
  Administered 2014-04-12: 500 mL via INTRAVENOUS

## 2014-04-12 MED ORDER — ONDANSETRON HCL 4 MG/2ML IJ SOLN
4.0000 mg | Freq: Once | INTRAMUSCULAR | Status: AC
  Administered 2014-04-12: 4 mg via INTRAVENOUS
  Filled 2014-04-12: qty 2

## 2014-04-12 MED ORDER — HYDROCODONE-ACETAMINOPHEN 5-325 MG PO TABS: 1.0000 | ORAL_TABLET | ORAL | Status: AC | PRN

## 2014-04-12 MED ORDER — HYDROMORPHONE HCL 1 MG/ML IJ SOLN
1.0000 mg | Freq: Once | INTRAMUSCULAR | Status: AC
  Administered 2014-04-12: 1 mg via INTRAVENOUS
  Filled 2014-04-12: qty 1

## 2014-04-12 NOTE — ED Provider Notes (Signed)
CSN: [id number]     Arrival date & time 04/12/14  2353 History   First MD Initiated Contact with Patient 04/12/14 [phone]     Chief Complaint  Patient presents with  . Abdominal Pain     (Consider location/radiation/quality/duration/timing/severity/associated sxs/prior Treatment) Patient is a 81 y.o. male presenting with abdominal pain. The history is provided by the patient and a relative. The history is limited by a language barrier. A language interpreter was used (family member assists with translation).  Abdominal Pain    Pt with hx HTN, HLD p/w left sided abdominal pain that has been constant and gradually worsening x 1 week with significant worsening overnight.  Pain is sharp, severe, radiates into groin, worse with movement and palpation.  Denies fevers, chills, myalgias, urinary symptoms, change in bowel habits, testicular swelling.  Specifically denies hematuria, dysuria, urinary frequency or urgency.  Denies diarrhea/constipation, hematochezia or melena.  Pt was in [place] when he developed the symptoms and returned home approximately 1 week ago.  Denies any symptoms besides pain while in [place].  Denies suspicious food or sick contacts.   Past Medical History  Diagnosis Date  . Hypertension   . Hyperchloremia   . Hypothyroid    History reviewed. No pertinent past surgical history. History reviewed. No pertinent family history. History  Substance Use Topics  . Smoking status: Never Smoker   . Smokeless tobacco: Never Used  . Alcohol Use: No    Review of Systems  Gastrointestinal: Positive for abdominal pain.  All other systems reviewed and are negative.     Allergies  Review of patient's allergies indicates no known allergies.  Home Medications   Prior to Admission medications   Medication Sig Start Date End Date Taking? Authorizing Provider  acarbose (PRECOSE) 25 MG tablet Take 1 tablet (25 mg total) by mouth daily before breakfast. 04/15/13   Elayne Snare, MD  aspirin  81 MG tablet Take 81 mg by mouth daily.    Historical Provider, MD  atorvastatin (LIPITOR) 10 MG tablet Take 1 tablet (10 mg total) by mouth daily. 04/15/13   Elayne Snare, MD  Blood Glucose Monitoring Suppl (ONE TOUCH ULTRA SYSTEM KIT) W/DEVICE KIT 1 kit by Does not apply route once. 10/16/12   Elayne Snare, MD  glucose blood (ONE TOUCH ULTRA TEST) test strip Use to check blood sugar 2 times per week dx code E11.9 12/25/13   Elayne Snare, MD  levothyroxine (SYNTHROID, LEVOTHROID) 112 MCG tablet Take 1 tablet (112 mcg total) by mouth daily before breakfast. 10/30/12   Elayne Snare, MD  losartan (COZAAR) 50 MG tablet Take 1 tablet (50 mg total) by mouth daily. 12/25/13   Elayne Snare, MD  nebivolol (BYSTOLIC) 2.5 MG tablet Take 1 tablet (2.5 mg total) by mouth daily. 12/25/13   Elayne Snare, MD  [facility] DELICA LANCETS 31V MISC Use to check blood sugar twice daily dx code 250.00 07/24/13   Elayne Snare, MD   BP 165/76 mmHg  Pulse 80  Temp(Src) 97.8 F (36.6 C) (Oral)  Resp 18  SpO2 99% Physical Exam  Constitutional: He appears well-developed and well-nourished. No distress.  HENT:  Head: Normocephalic and atraumatic.  Neck: Neck supple.  Cardiovascular: Normal rate and regular rhythm.   Pulmonary/Chest: Effort normal and breath sounds normal. No respiratory distress. He has no wheezes. He has no rales.  Abdominal: Soft. He exhibits no distension. There is tenderness in the epigastric area, left upper quadrant and left lower quadrant. There is CVA tenderness (left). There  is no rebound and no guarding. Hernia confirmed negative in the left inguinal area.  Genitourinary: Right testis shows no mass, no swelling and no tenderness. Left testis shows no mass, no swelling and no tenderness.  Neurological: He is alert. He exhibits normal muscle tone.  Skin: He is not diaphoretic.  Nursing note and vitals reviewed.   ED Course  Procedures (including critical care time) Labs Review Labs Reviewed  CBC WITH  DIFFERENTIAL/PLATELET - Abnormal; Notable for the following:    RBC 4.19 (*)    HCT 38.4 (*)    All other components within normal limits  BASIC METABOLIC PANEL - Abnormal; Notable for the following:    Glucose, Bld 239 (*)    GFR calc non Af Amer 49 (*)    GFR calc Af Amer 57 (*)    All other components within normal limits  URINALYSIS, ROUTINE W REFLEX MICROSCOPIC  LIPASE, BLOOD  HEPATIC FUNCTION PANEL  I-STAT CG4 LACTIC ACID, ED    Imaging Review Ct Abdomen Pelvis W Contrast  04/12/2014   CLINICAL DATA:  Left lower quadrant pain radiating to groin  EXAM: CT ABDOMEN AND PELVIS WITH CONTRAST  TECHNIQUE: Multidetector CT imaging of the abdomen and pelvis was performed using the standard protocol following bolus administration of intravenous contrast.  CONTRAST:  46m OMNIPAQUE IOHEXOL 300 MG/ML  SOLN  COMPARISON:  None.  FINDINGS: Lower chest:  Lung bases are clear.  Hepatobiliary: Liver is within normal limits.  Status post cholecystectomy. No intrahepatic or extrahepatic ductal dilatation.  Pancreas: Within normal limits.  Spleen: Within normal limits.  Adrenals/Urinary Tract: Adrenal glands are unremarkable.  12 mm interpolar left renal cyst (series 7/image 15). Additional 7 mm left lower pole renal cyst (series 7/ image 18). Right kidney is within normal limits. No hydronephrosis.  Bladder is within normal limits.  Stomach/Bowel: Stomach is within normal limits.  No evidence of bowel obstruction.  Normal appendix.  No colonic wall thickening or inflammatory changes.  Vascular/Lymphatic: Atherosclerotic calcifications of the abdominal aorta and branch vessels.  No suspicious abdominopelvic lymphadenopathy.  Reproductive: Prostate is unremarkable.  Other: No abdominopelvic ascites.  Fat within the left inguinal canal.  Musculoskeletal: Degenerative changes of the visualized thoracolumbar spine.  Status post PLIF at L2-5.  IMPRESSION: No evidence of bowel obstruction.  Normal appendix.  No CT  findings to account for the patient's left lower quadrant abdominal pain.   Electronically Signed   By: SJulian HyM.D.   On: 04/12/2014 12:30   Dg Abd Acute W/chest  04/12/2014   CLINICAL DATA:  Left-sided abdominal pain for 2 days.  EXAM: ACUTE ABDOMEN SERIES (ABDOMEN 2 VIEW & CHEST 1 VIEW)  COMPARISON:  Chest x-ray dated 07/27/2010 and abdominal radiograph dated 09/25/2009  FINDINGS: Heart size and pulmonary vascularity are normal. Stable scarring at the lung bases. Tortuosity and calcification of the thoracic aorta.  No free air or free fluid in the abdomen. The bowel gas pattern is normal. No acute osseous abnormality. Previous lumbar fusion from L2-L5.  IMPRESSION: No acute abnormalities of the abdomen are chest.   Electronically Signed   By: JLorriane ShireM.D.   On: 04/12/2014 11:10     EKG Interpretation None       10:08 AM Dr ZReather Conversemade aware of patient.   Patient seen and examined by Dr ZReather Converseas well.  Workup, results, plan discussed with Dr ZReather Conversewho agrees with plan.    MDM   Final diagnoses:  LLQ abdominal pain  Afebrile, nontoxic patient with left sided abdominal pain and no other associated symptoms.   Pain present one week, worsening yesterday.  Pt has significant tenderness on abdominal exam.  His workup was negative including CBC, CMP, lipase, UA, CT abd/pelvis.  No testicular symptoms or tenderness.  I discussed this with patient and family members and they prefer d/c home with pain medication with close PCP follow up vs hospital admission for observation.  Discussed strict return precautions with the patient and family members.   D/C home with close PCP follow up, pain medication, return precautions.  Discussed result, findings, treatment, and follow up  with patient.  Pt given return precautions.  Pt verbalizes understanding and agrees with plan.         Clayton Bibles, PA-C 04/12/14 1603  Mariea Clonts, MD 04/12/14 [phone]

## 2014-04-12 NOTE — ED Notes (Signed)
Pt states he will try to pee after CT

## 2014-04-12 NOTE — Discharge Instructions (Signed)
Read the information below.  Use the prescribed medication as directed.  Please discuss all new medications with your pharmacist.  Do not take additional tylenol while taking the prescribed pain medication to avoid overdose.  You may return to the Emergency Department at any time for worsening condition or any new symptoms that concern you.  If you develop high fevers, worsening abdominal pain, uncontrolled vomiting, or are unable to tolerate fluids by mouth, return to the ER for a recheck.  Please follow up with Dr Waynard EdwardsPerini in the next 2-3 days.  Please do not hesitate to return to the Emergency Department for any concerning symptoms or uncontrolled pain.     Pain of Unknown Etiology (Pain Without a Known Cause) You have come to your caregiver because of pain. Pain can occur in any part of the body. Often there is not a definite cause. If your laboratory (blood or urine) work was normal and X-rays or other studies were normal, your caregiver may treat you without knowing the cause of the pain. An example of this is the headache. Most headaches are diagnosed by taking a history. This means your caregiver asks you questions about your headaches. Your caregiver determines a treatment based on your answers. Usually testing done for headaches is normal. Often testing is not done unless there is no response to medications. Regardless of where your pain is located today, you can be given medications to make you comfortable. If no physical cause of pain can be found, most cases of pain will gradually leave as suddenly as they came.  If you have a painful condition and no reason can be found for the pain, it is important that you follow up with your caregiver. If the pain becomes worse or does not go away, it may be necessary to repeat tests and look further for a possible cause.  Only take over-the-counter or prescription medicines for pain, discomfort, or fever as directed by your caregiver.  For the protection of  your privacy, test results cannot be given over the phone. Make sure you receive the results of your test. Ask how these results are to be obtained if you have not been informed. It is your responsibility to obtain your test results.  You may continue all activities unless the activities cause more pain. When the pain lessens, it is important to gradually resume normal activities. Resume activities by beginning slowly and gradually increasing the intensity and duration of the activities or exercise. During periods of severe pain, bed rest may be helpful. Lie or sit in any position that is comfortable.  Ice used for acute (sudden) conditions may be effective. Use a large plastic bag filled with ice and wrapped in a towel. This may provide pain relief.  See your caregiver for continued problems. Your caregiver can help or refer you for exercises or physical therapy if necessary. If you were given medications for your condition, do not drive, operate machinery or power tools, or sign legal documents for 24 hours. Do not drink alcohol, take sleeping pills, or take other medications that may interfere with treatment. See your caregiver immediately if you have pain that is becoming worse and not relieved by medications. Document Released: 10/24/2000 Document Revised: 11/19/2012 Document Reviewed: 01/29/2005 [facility] Patient Information 2015 [place], [place]. This information is not intended to replace advice given to you by your health care provider. Make sure you discuss any questions you have with your health care provider.  Abdominal Pain Many things can cause  abdominal pain. Usually, abdominal pain is not caused by a disease and will improve without treatment. It can often be observed and treated at home. Your health care provider will do a physical exam and possibly order blood tests and X-rays to help determine the seriousness of your pain. However, in many cases, more time must pass before a clear cause  of the pain can be found. Before that point, your health care provider may not know if you need more testing or further treatment. HOME CARE INSTRUCTIONS  Monitor your abdominal pain for any changes. The following actions may help to alleviate any discomfort you are experiencing:  Only take over-the-counter or prescription medicines as directed by your health care provider.  Do not take laxatives unless directed to do so by your health care provider.  Try a clear liquid diet (broth, tea, or water) as directed by your health care provider. Slowly move to a bland diet as tolerated. SEEK MEDICAL CARE IF:  You have unexplained abdominal pain.  You have abdominal pain associated with nausea or diarrhea.  You have pain when you urinate or have a bowel movement.  You experience abdominal pain that wakes you in the night.  You have abdominal pain that is worsened or improved by eating food.  You have abdominal pain that is worsened with eating fatty foods.  You have a fever. SEEK IMMEDIATE MEDICAL CARE IF:   Your pain does not go away within 2 hours.  You keep throwing up (vomiting).  Your pain is felt only in portions of the abdomen, such as the right side or the left lower portion of the abdomen.  You pass bloody or black tarry stools. MAKE SURE YOU:  Understand these instructions.   Will watch your condition.   Will get help right away if you are not doing well or get worse.  Document Released: 11/08/2004 Document Revised: 02/03/2013 Document Reviewed: 10/08/2012 [facility] Patient Information 2015 [place], [place]. This information is not intended to replace advice given to you by your health care provider. Make sure you discuss any questions you have with your health care provider.

## 2014-04-12 NOTE — ED Notes (Signed)
Pt c/o LLQ pain with radiation into groin and leg x 1 week much worse x 2 days

## 2014-05-05 ENCOUNTER — Other Ambulatory Visit: Payer: Self-pay | Admitting: Endocrinology

## 2014-09-24 ENCOUNTER — Other Ambulatory Visit: Payer: Self-pay | Admitting: Endocrinology

## 2014-09-24 NOTE — Telephone Encounter (Signed)
Pharmacy is requesting a refill of Atorvastatin, rx was denied, patient has not been seen since November 2015

## 2014-11-01 ENCOUNTER — Other Ambulatory Visit: Payer: Self-pay | Admitting: Endocrinology

## 2014-12-05 ENCOUNTER — Other Ambulatory Visit: Payer: Self-pay | Admitting: Endocrinology

## 2015-04-26 ENCOUNTER — Ambulatory Visit (INDEPENDENT_AMBULATORY_CARE_PROVIDER_SITE_OTHER): Payer: Medicare PPO | Admitting: Neurology

## 2015-04-26 ENCOUNTER — Encounter: Payer: Self-pay | Admitting: Neurology

## 2015-04-26 VITALS — BP 180/78 | HR 76 | Resp 18 | Ht 68.0 in | Wt 181.0 lb

## 2015-04-26 DIAGNOSIS — R0681 Apnea, not elsewhere classified: Secondary | ICD-10-CM | POA: Diagnosis not present

## 2015-04-26 DIAGNOSIS — H9193 Unspecified hearing loss, bilateral: Secondary | ICD-10-CM | POA: Diagnosis not present

## 2015-04-26 DIAGNOSIS — R0683 Snoring: Secondary | ICD-10-CM

## 2015-04-26 DIAGNOSIS — F0390 Unspecified dementia without behavioral disturbance: Secondary | ICD-10-CM

## 2015-04-26 MED ORDER — DONEPEZIL HCL 5 MG PO TABS: 5.0000 mg | ORAL_TABLET | Freq: Every day | ORAL | Status: AC

## 2015-04-26 NOTE — Patient Instructions (Addendum)
You have complaints of memory loss: memory loss or changes in cognitive function can have many reasons and does not always mean you have dementia. Conditions that can contribute to subjective or objective memory loss include: depression, stress, poor sleep from insomnia or sleep apnea, dehydration, fluctuation in blood sugar values, thyroid or electrolyte dysfunction and certain vitamin deficiencies. Dementia can be caused by stroke, brain atherosclerosis or brain vascular disease due to vascular risk factors (smoking, high blood pressure, high cholesterol, obesity and uncontrolled diabetes), certain degenerative brain disorders (including Parkinson's disease and Multiple sclerosis) and by Alzheimer's disease or other, more rare and sometimes hereditary causes. We will do some additional testing: blood work (which has been done recently already) and we will do a brain scan. We will not start medication as yet. We will also request a formal cognitive test called neuropsychological evaluation which is done by a licensed neuropsychologist. We will make a referral in that regard. We will call you with brain scan test results and monitor your symptoms. Your memory loss is rather mild at this point, which, of course is reassuring.    We will request a brain MRI and call you with the test results. We will have to schedule you for this on a separate date. This test requires authorization from your insurance, and we will take care of the insurance process.  Based on your symptoms and your exam I believe you are at risk for obstructive sleep apnea or OSA, and I think we should proceed with a sleep study to determine whether you do or do not have OSA and how severe it is. If you have more than mild OSA, I want you to consider treatment with CPAP. Please remember, the risks and ramifications of moderate to severe obstructive sleep apnea or OSA are: Cardiovascular disease, including congestive heart failure, stroke,  difficult to control hypertension, arrhythmias, and even type 2 diabetes has been linked to untreated OSA. Sleep apnea causes disruption of sleep and sleep deprivation in most cases, which, in turn, can cause recurrent headaches, problems with memory, mood, concentration, focus, and vigilance. Most people with untreated sleep apnea report excessive daytime sleepiness, which can affect their ability to drive. Please do not drive if you feel sleepy.  I would like for you to discuss a sleep study with your family.   Aricept (generic name: donepezil) 5 mg: take one pill each evening. Common side effects may include dry eyes, dry mouth, confusion, low pulse, low blood pressure, also GI related side effects (nausea, vomiting, diarrhea, constipation), headaches; rare side effects may include hallucinations and seizures.

## 2015-04-26 NOTE — Progress Notes (Signed)
Subjective:    Patient ID: Logan Rush is a 82 y.o. male.  HPI     Star Age, MD, PhD [facility] [address], [address] [place], [place] [address] Dear Dr. Joylene Draft,   I saw your patient, Logan Rush, upon your kind request in my neurologic clinic today for initial consultation of her memory loss. The patient is accompanied by his daughter-in-law today. As you know, Logan Rush is an 82 year old right-handed gentleman with an underlying medical history of diabetes, hypertension, back surgeries x 3 with residual low back pain ( on tramadol, has been status post injections), hyperlipidemia, hypothyroidism, hearing loss, and overweight state, who reports memory loss for the past several years, likely over 52 y. He has had forgetfulness, including misplacing things like his phone or cane. He has had trouble keeping up with his finances and needed help with that from his family.  He is widowed. He has 7 daughters and 1 son. His children all live in [place]. He has 21 grandchildren and 4 great-grandchildren. He follows a vegetarian diet, does not smoke or drink alcohol. 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 He is primarily [place] speaking. He has limited [place], his daughter-in-law is fluent and provides information. Unfortunately, I don't speak [place], but can communicate in Hindi or Urdu. He has no overt family history of dementia. His brother died at the age of 53. His mother died young at 42 in childbirth. His father lived to be 46 years old. May have had a TIA in the past, about 6 years ago, some slurring of speech.  I reviewed your office note from 04/07/15, which you kindly included. You ordered recent blood work including ferritin, B12, iron studies and A1c.  He had a brain MRI without contrast on 01/22/2012: IMPRESSION: Age related atrophy and chronic microvascular  ischemic change.  No acute intracranial abnormality.  Slight progression from 2010. In addition, I personally reviewed the images through the PACS system. He went through elem. school in [place], about fifth grade education and he has residual back pain. He takes tramadol, usually once a night. He has been on narcotic pain medication but had problems with his balance. He is hard of hearing but is not quite ready for hearing aids, per daughter-in-law. He likes to walk regularly. Of note, he snores, he has some daytime tiredness and somnolence. He has made abnormal sounds in his sleep and daughter-in-law has noted pauses in his breathing at times. He had a sleep study many years ago from what I understand.  He drives very little. There have been no recent issues with driving. There is no report of hallucinations or delusions.  His Past Medical History Is Significant For: Past Medical History  Diagnosis Date  . Hypertension   . Hyperchloremia   . Hypothyroid   . Hyperlipemia   . Diabetes mellitus without complication [facility])     Her Past Surgical History Is Significant For: Past Surgical History  Procedure Laterality Date  . Back surgery    . Cholecystectomy      Her Family History Is Significant For: No family history on file.  His Social History Is Significant For: Social History   Social History  . Marital Status: Widowed    Spouse Name: N/A  . Number of Children: 8  . Years of Education: Elementary   Occupational History  . Retired  Social History Main Topics  . Smoking status: Never Smoker   . Smokeless tobacco: Never Used  . Alcohol Use: No  . Drug Use: No  . Sexual Activity: Not Asked   Other Topics Concern  . None   Social History Narrative   Denies caffeine use     His Allergies Are:  No Known Allergies:   His Current Medications Are:  Outpatient Encounter Prescriptions as of 04/26/2015  Medication Sig  . acarbose (PRECOSE) 25 MG tablet Take 1 tablet (25  mg total) by mouth daily before breakfast.  . aspirin 81 MG tablet Take 81 mg by mouth daily.  Marland Kitchen atorvastatin (LIPITOR) 20 MG tablet TK 1 T PO QD  . Blood Glucose Monitoring Suppl (ONE TOUCH ULTRA SYSTEM KIT) W/DEVICE KIT 1 kit by Does not apply route once.  Marland Kitchen HYDROcodone-acetaminophen (NORCO/VICODIN) 5-325 MG per tablet Take 1-2 tablets by mouth every 4 (four) hours as needed for moderate pain or severe pain.  Marland Kitchen levothyroxine (SYNTHROID, LEVOTHROID) 112 MCG tablet Take 1 tablet (112 mcg total) by mouth daily before breakfast.  . losartan (COZAAR) 50 MG tablet Take 1 tablet (50 mg total) by mouth daily.  Marland Kitchen LYRICA 150 MG capsule TK 1 C PO QAM AND TK 2 CS Q NIGHT  . nebivolol (BYSTOLIC) 2.5 MG tablet Take 1 tablet (2.5 mg total) by mouth daily.  . ONE TOUCH ULTRA TEST test strip USE TO CHECK BLOOD SUGAR EVERY DAY  . ONETOUCH DELICA LANCETS 54M MISC USE TWICE DAILY  . traMADol (ULTRAM) 50 MG tablet TK 1 T PO QID  . VOLTAREN 1 % GEL APP TO LOWER BACK BID  . [DISCONTINUED] atorvastatin (LIPITOR) 10 MG tablet Take 1 tablet (10 mg total) by mouth daily.  Marland Kitchen donepezil (ARICEPT) 5 MG tablet Take 1 tablet (5 mg total) by mouth at bedtime.   No facility-administered encounter medications on file as of 04/26/2015.  :  Review of Systems:  Out of a complete 14 point review of systems, all are reviewed and negative with the exception of these symptoms as listed below:    Review of Systems  Constitutional: Positive for fatigue.  HENT: Positive for hearing loss and trouble swallowing.   Respiratory:       Snoring   Neurological:       Daughter-in-law is here with patient. Patient does not speak english. Patient lives with her and husband. States that his short term memory is getting worse.   Psychiatric/Behavioral: Positive for confusion.    Objective:  Neurologic Exam  Physical Exam Physical Examination:   Filed Vitals:   04/26/15 1555  BP: 180/78  Pulse: 76  Resp: 18    General  Examination: The patient is a very pleasant 82 y.o. male in no acute distress. He appears well-developed and well-nourished and well groomed.   HEENT: Normocephalic, atraumatic, pupils are equal, round and reactive to light and accommodation. Funduscopic exam is normal with sharp disc margins noted. Extraocular tracking is good without limitation to gaze excursion or nystagmus noted. Normal smooth pursuit is noted. Hearing is grossly intact. Tympanic membranes are clear bilaterally. Face is symmetric with normal facial animation and normal facial sensation. Speech is clear with no dysarthria noted. There is no hypophonia. There is no lip, neck/head, jaw or voice tremor. Neck is supple with full range of passive and active motion. There are no carotid bruits on auscultation. Oropharynx exam reveals: mild mouth dryness, adequate dental hygiene and moderate airway crowding, due to redundant soft  palate, thicker uvula and tonsils in place. Mallampati is class II. Tongue protrudes centrally and palate elevates symmetrically.   Chest: Clear to auscultation without wheezing, rhonchi or crackles noted.  Heart: S1+S2+0, regular and normal without murmurs, rubs or gallops noted.   Abdomen: Soft, non-tender and non-distended with normal bowel sounds appreciated on auscultation.  Extremities: There is no pitting edema in the distal lower extremities bilaterally. Pedal pulses are intact.  Skin: Warm and dry without trophic changes noted. There are no varicose veins.  Musculoskeletal: exam reveals no obvious joint deformities, tenderness or joint swelling or erythema, except for low back pain.   Neurologically:  Mental status: The patient is awake, alert and oriented in all 4 spheres. His immediate and remote memory, attention, language skills and fund of knowledge are mildly impaired.   On 04/26/2015: MMSE 26/30, CDT: 3/4, AFT: 9/min.   There is no evidence of aphasia, agnosia, apraxia or anomia. Speech is  clear with normal prosody and enunciation. Thought process is linear. Mood is normal and affect is normal.  Cranial nerves II - XII are as described above under HEENT exam. In addition: shoulder shrug is normal with equal shoulder height noted. Motor exam: Normal bulk, strength and tone is noted. There is no drift, tremor or rebound. Romberg is negative. Reflexes are 1+ throughout. Babinski: Toes are flexor. Fine motor skills and coordination: intact with normal finger taps, normal hand movements, normal rapid alternating patting, normal foot taps and normal foot agility.  Cerebellar testing: No dysmetria or intention tremor on finger to nose testing.  Sensory exam: intact to in the UEs and LEs.   Gait, station and balance: He stands with difficulty and has LBP. No veering to one side is noted. Posture is mildly stooped. He walks cautiously and uses a single-point cane. He has residual back pain while walking. Tandem walk is unremarkable.                Assessment and Plan:   In summary, Logan Rush is a very pleasant 82 y.o.-year old male with an underlying medical history of diabetes, hypertension, back surgeries x 3 with residual low back pain ( on tramadol, has been status post injections), hyperlipidemia, hypothyroidism, hearing loss, and overweight state, who presents with a several year history of memory loss, most typically short-term memory issues, no behavioral changes reported, no atypical findings such as hallucinations or delusions, no frank family history of dementia. His history and physical exam are in keeping with mild memory loss, could be mild dementia without behavioral disturbance, could be Alzheimer's disease. In addition, his history and physical exam are concerning for obstructive sleep apnea for which I suggested proceeding with a split-night sleep study. The patient is reluctant. I suggested they discuss this as a family and see if he changes his mind, we can always do a  sleep study here. I had a long chat with the patient and Richmond Campbell, his daughter-in-law about my findings and the diagnosis of memory loss and dementia, its prognosis and treatment options. Implications of diagnosis explained at length with the patient and caregiver. We talked about medical treatments and non-pharmacological approaches. We talked about maintaining a healthy lifestyle in general and staying active mentally and physically. I encouraged the patient to eat healthy, exercise daily and keep well hydrated, to keep a scheduled bedtime and wake time routine, to not skip any meals and eat healthy snacks in between meals and to have protein with every meal. I stressed the importance  of regular exercise, within of course the patient's own mobility limitations. I encouraged the patient to keep up with current events by reading the news paper or watching the news and to do word puzzles, or if feasible, to go on [email or web address].  He has a great support system. He tries to exercise regularly, and tries to drink water.  As far as further diagnostic testing is concerned, I suggested the following: MRI brain without contrast,and we can compare findings to December 2013. In addition, we will consider sleep study testing as treatment of obstructive sleep apnea may improve memory and cognitive function. As far as medications are concerned, I recommended the following at this time:Trial of low-dose Aricept generic starting with 5 mg once daily. I provided him with written instructions, and we talked about potential side effects and caution with respect to his age. I provided them with a new prescription as well. I would like to see him back in 3 months, sooner if needed. They will call me back in the interim if they decide to pursue a sleep study. We will also be in touch with regards to his brain MRI results in the interim. I answered all their questions today and the patient and [place] were in agreement with the above  outlined plan.  Thank you very much for allowing me to participate in the care of this nice patient. If I can be of any further assistance to you please do not hesitate to call me at [phone].  Sincerely,   Star Age, MD, PhD

## 2015-05-12 ENCOUNTER — Inpatient Hospital Stay: Admission: RE | Admit: 2015-05-12 | Payer: Medicare PPO | Source: Ambulatory Visit

## 2015-05-19 ENCOUNTER — Ambulatory Visit
Admission: RE | Admit: 2015-05-19 | Discharge: 2015-05-19 | Disposition: A | Discharge status: Home / Self Care | Payer: Medicare PPO | Source: Ambulatory Visit | Attending: Neurology | Admitting: Neurology

## 2015-05-19 DIAGNOSIS — R0681 Apnea, not elsewhere classified: Secondary | ICD-10-CM

## 2015-05-19 DIAGNOSIS — R0683 Snoring: Secondary | ICD-10-CM

## 2015-05-19 DIAGNOSIS — F0390 Unspecified dementia without behavioral disturbance: Secondary | ICD-10-CM

## 2015-05-20 ENCOUNTER — Encounter (HOSPITAL_COMMUNITY): Payer: Self-pay | Admitting: Emergency Medicine

## 2015-05-20 ENCOUNTER — Emergency Department (HOSPITAL_COMMUNITY): Payer: Medicare PPO

## 2015-05-20 ENCOUNTER — Emergency Department (HOSPITAL_COMMUNITY)
Admission: EM | Admit: 2015-05-20 | Discharge: 2015-05-20 | Disposition: A | Discharge status: Home / Self Care | Payer: Medicare PPO | Attending: Emergency Medicine | Admitting: Emergency Medicine

## 2015-05-20 DIAGNOSIS — W182XXA Fall in (into) shower or empty bathtub, initial encounter: Secondary | ICD-10-CM | POA: Diagnosis not present

## 2015-05-20 DIAGNOSIS — Z79899 Other long term (current) drug therapy: Secondary | ICD-10-CM | POA: Insufficient documentation

## 2015-05-20 DIAGNOSIS — Z7982 Long term (current) use of aspirin: Secondary | ICD-10-CM | POA: Diagnosis not present

## 2015-05-20 DIAGNOSIS — Y92002 Bathroom of unspecified non-institutional (private) residence single-family (private) house as the place of occurrence of the external cause: Secondary | ICD-10-CM | POA: Diagnosis not present

## 2015-05-20 DIAGNOSIS — I10 Essential (primary) hypertension: Secondary | ICD-10-CM | POA: Diagnosis not present

## 2015-05-20 DIAGNOSIS — Y9389 Activity, other specified: Secondary | ICD-10-CM | POA: Insufficient documentation

## 2015-05-20 DIAGNOSIS — S299XXA Unspecified injury of thorax, initial encounter: Secondary | ICD-10-CM | POA: Insufficient documentation

## 2015-05-20 DIAGNOSIS — W19XXXA Unspecified fall, initial encounter: Secondary | ICD-10-CM

## 2015-05-20 DIAGNOSIS — E119 Type 2 diabetes mellitus without complications: Secondary | ICD-10-CM | POA: Diagnosis not present

## 2015-05-20 DIAGNOSIS — E039 Hypothyroidism, unspecified: Secondary | ICD-10-CM | POA: Diagnosis not present

## 2015-05-20 DIAGNOSIS — S79912A Unspecified injury of left hip, initial encounter: Secondary | ICD-10-CM | POA: Diagnosis not present

## 2015-05-20 DIAGNOSIS — S0990XA Unspecified injury of head, initial encounter: Secondary | ICD-10-CM | POA: Insufficient documentation

## 2015-05-20 DIAGNOSIS — S3991XA Unspecified injury of abdomen, initial encounter: Secondary | ICD-10-CM | POA: Insufficient documentation

## 2015-05-20 DIAGNOSIS — Y999 Unspecified external cause status: Secondary | ICD-10-CM | POA: Diagnosis not present

## 2015-05-20 DIAGNOSIS — E785 Hyperlipidemia, unspecified: Secondary | ICD-10-CM | POA: Insufficient documentation

## 2015-05-20 LAB — COMPREHENSIVE METABOLIC PANEL
ALBUMIN: 4 g/dL (ref 3.5–5.0)
ALK PHOS: 55 U/L (ref 38–126)
ALT: 37 U/L (ref 17–63)
AST: 46 U/L — ABNORMAL HIGH (ref 15–41)
Anion gap: 9 (ref 5–15)
BUN: 13 mg/dL (ref 6–20)
CALCIUM: 9.5 mg/dL (ref 8.9–10.3)
CHLORIDE: 103 mmol/L (ref 101–111)
CO2: 25 mmol/L (ref 22–32)
CREATININE: 1.15 mg/dL (ref 0.61–1.24)
GFR calc Af Amer: >60 mL/min (ref >60)
GFR calc non Af Amer: 56 mL/min — ABNORMAL LOW (ref >60)
GLUCOSE: 102 mg/dL — AB (ref 65–99)
Potassium: 4.5 mmol/L (ref 3.5–5.1)
SODIUM: 137 mmol/L (ref 135–145)
Total Bilirubin: 1 mg/dL (ref 0.3–1.2)
Total Protein: 7 g/dL (ref 6.5–8.1)

## 2015-05-20 LAB — CBC WITH DIFFERENTIAL/PLATELET
BASOS ABS: 0 10*3/uL (ref 0.0–0.1)
Basophils Relative: 1 %
EOS PCT: 2 %
Eosinophils Absolute: 0.2 10*3/uL (ref 0.0–0.7)
HCT: 40.5 % (ref 39.0–52.0)
HEMOGLOBIN: 13.6 g/dL (ref 13.0–17.0)
LYMPHS PCT: 34 %
Lymphs Abs: 2.5 10*3/uL (ref 0.7–4.0)
MCH: 31.6 pg (ref 26.0–34.0)
MCHC: 33.6 g/dL (ref 30.0–36.0)
MCV: 94.2 fL (ref 78.0–100.0)
Monocytes Absolute: 0.9 10*3/uL (ref 0.1–1.0)
Monocytes Relative: 12 %
Neutro Abs: 3.9 10*3/uL (ref 1.7–7.7)
Neutrophils Relative %: 51 %
PLATELETS: 178 10*3/uL (ref 150–400)
RBC: 4.3 MIL/uL (ref 4.22–5.81)
RDW: 13.2 % (ref 11.5–15.5)
WBC: 7.4 10*3/uL (ref 4.0–10.5)

## 2015-05-20 LAB — URINALYSIS, ROUTINE W REFLEX MICROSCOPIC
BILIRUBIN URINE: NEGATIVE
Glucose, UA: NEGATIVE mg/dL
Hgb urine dipstick: NEGATIVE
Ketones, ur: NEGATIVE mg/dL
Leukocytes, UA: NEGATIVE
NITRITE: NEGATIVE
Protein, ur: NEGATIVE mg/dL
SPECIFIC GRAVITY, URINE: 1.005 (ref 1.005–1.030)
pH: 6 (ref 5.0–8.0)

## 2015-05-20 LAB — I-STAT TROPONIN, ED: Troponin i, poc: 0.01 ng/mL (ref 0.00–0.08)

## 2015-05-20 LAB — CBG MONITORING, ED: Glucose-Capillary: 99 mg/dL (ref 65–99)

## 2015-05-20 LAB — PROTIME-INR
INR: 1.06 (ref 0.00–1.49)
Prothrombin Time: 14 seconds (ref 11.6–15.2)

## 2015-05-20 MED ORDER — TRAMADOL HCL 50 MG PO TABS
50.0000 mg | ORAL_TABLET | Freq: Once | ORAL | Status: DC
  Filled 2015-05-20: qty 1

## 2015-05-20 MED ORDER — ACETAMINOPHEN 325 MG PO TABS
650.0000 mg | ORAL_TABLET | Freq: Once | ORAL | Status: AC
  Administered 2015-05-20: 650 mg via ORAL
  Filled 2015-05-20: qty 2

## 2015-05-20 MED ORDER — IOPAMIDOL (ISOVUE-300) INJECTION 61%
INTRAVENOUS | Status: AC
  Administered 2015-05-20: 100 mL
  Filled 2015-05-20: qty 100

## 2015-05-20 NOTE — Progress Notes (Signed)
Quick Note:  Please call patient regarding the recent brain MRI: The brain scan showed a normal structure of the brain and moderate volume loss which we call atrophy. There were changes in the deeper structures of the brain, which we call white matter changes or microvascular changes. These were reported as moderate in His case. These are tiny white spots, that occur with time and are seen in a variety of conditions, including with normal aging, chronic hypertension, chronic headaches, especially migraine HAs, chronic diabetes, chronic hyperlipidemia. These are not strokes and no mass or lesion were seen which is reassuring. Again, there were no acute findings, such as a stroke, or mass or blood products. No further action is required on this test at this time, other than re-enforcing the importance of good blood pressure control, good cholesterol control, good blood sugar control, and weight management. Please remind patient to keep any upcoming appointments or tests and to call us with any interim questions, concerns, problems or updates. Thanks,  Huston FoleySaima Sheridyn Canino, MD, PhD    ______

## 2015-05-20 NOTE — ED Notes (Signed)
Assisted up on side of bed to void , 1000 cc ua to lab

## 2015-05-20 NOTE — ED Notes (Signed)
Family reports that yesterday pt was in the bathroom taking pills when he fell backwards into a tub. Pt already had an appointment for MRI yesterday. Family noticed that pt pain has increased and pt has become more stiff.

## 2015-05-20 NOTE — ED Notes (Signed)
Per pts family he had tltited his head back to take meds yesterday and fell backwards into tub, hitting his head, has a lump that is tender to back of left side of head, pt has c/o of being more stiff and being in pain since falling yesterday, pt had issues with dizziness before and family thinks speech may be slurred alittle since fall, pt had MRI yesterday after fall that had already been scheduled pt is just on asa no other blood thinners

## 2015-05-20 NOTE — Discharge Instructions (Signed)
You have been seen today for evaluation following a fall. Your imaging and lab tests showed no abnormalities. Follow up with PCP as needed. Return to ED should another fall occur. You may have symptoms of a concussion or post-concussive syndrome. Refer to the attached literature for details.

## 2015-05-20 NOTE — ED Notes (Signed)
Offered ordered pain medication to patient - pt states he will hold off for now. MD made aware.

## 2015-05-20 NOTE — ED Notes (Signed)
IV team at bedside 

## 2015-05-20 NOTE — ED Provider Notes (Signed)
CSN: [id number]     Arrival date & time 05/20/15  1033 History   First MD Initiated Contact with Patient 05/20/15 1220     Chief Complaint  Patient presents with  . Fall  . Head Injury  . Neck Pain     (Consider location/radiation/quality/duration/timing/severity/associated sxs/prior Treatment) HPI   Logan Rush is a 82 y.o. male, with a history of hypertension and DM, presenting to the ED with a fall that occurred yesterday morning. Pt was taking his medications and when he tilted his head back, he lost his balance, tripped backward over the edge of the tub, and hit his head and back on the tub. Logan Rush is the patient's daughter in law and his 38. Daughter states later that night his speech began to be slower than normal. States that pt is acting and speaking normally now. Pt complains of pain to the back of his head, his left hip, and in his left lower quadrant. Patient rates all his pain at 7 out of 10, throbbing, nonradiating. Patient denies LOC, nausea/vomiting, chest pain, shortness of breath, dizziness, or any other complaints. Patient denies anticoagulation beyond a daily aspirin. MRI was performed yesterday for memory loss.    Past Medical History  Diagnosis Date  . Hypertension   . Hyperchloremia   . Hypothyroid   . Hyperlipemia   . Diabetes mellitus without complication [facility])    Past Surgical History  Procedure Laterality Date  . Back surgery    . Cholecystectomy     No family history on file. Social History  Substance Use Topics  . Smoking status: Never Smoker   . Smokeless tobacco: Never Used  . Alcohol Use: No    Review of Systems  Constitutional: Negative for fever, chills and diaphoresis.       Fall  Respiratory: Negative for shortness of breath.   Cardiovascular: Negative for chest pain.  Gastrointestinal: Negative for nausea, vomiting, abdominal pain and diarrhea.  Neurological: Negative for dizziness, facial asymmetry, speech  difficulty, weakness, light-headedness and headaches.  All other systems reviewed and are negative.     Allergies  Review of patient's allergies indicates no known allergies.  Home Medications   Prior to Admission medications   Medication Sig Start Date End Date Taking? Authorizing Provider  acarbose (PRECOSE) 25 MG tablet Take 1 tablet (25 mg total) by mouth daily before breakfast. 04/15/13   Elayne Snare, MD  aspirin 81 MG tablet Take 81 mg by mouth daily.    Historical Provider, MD  atorvastatin (LIPITOR) 20 MG tablet TK 1 T PO QD 04/07/15   Historical Provider, MD  Blood Glucose Monitoring Suppl (ONE TOUCH ULTRA SYSTEM KIT) W/DEVICE KIT 1 kit by Does not apply route once. 10/16/12   Elayne Snare, MD  donepezil (ARICEPT) 5 MG tablet Take 1 tablet (5 mg total) by mouth at bedtime. 04/26/15   Star Age, MD  HYDROcodone-acetaminophen (NORCO/VICODIN) 5-325 MG per tablet Take 1-2 tablets by mouth every 4 (four) hours as needed for moderate pain or severe pain. 04/12/14   Clayton Bibles, PA-C  levothyroxine (SYNTHROID, LEVOTHROID) 112 MCG tablet Take 1 tablet (112 mcg total) by mouth daily before breakfast. 10/30/12   Elayne Snare, MD  losartan (COZAAR) 50 MG tablet Take 1 tablet (50 mg total) by mouth daily. 12/25/13   Elayne Snare, MD  LYRICA 150 MG capsule TK 1 C PO QAM AND TK 2 CS Q NIGHT 04/04/15   Historical Provider, MD  nebivolol (BYSTOLIC) 2.5 MG  tablet Take 1 tablet (2.5 mg total) by mouth daily. 12/25/13   Elayne Snare, MD  ONE TOUCH ULTRA TEST test strip USE TO CHECK BLOOD SUGAR EVERY DAY 11/01/14   Elayne Snare, MD  [facility] DELICA LANCETS 69G MISC USE TWICE DAILY 12/06/14   Elayne Snare, MD  traMADol (ULTRAM) 50 MG tablet TK 1 T PO QID 04/04/15   Historical Provider, MD  VOLTAREN 1 % GEL APP TO LOWER BACK BID 04/15/15   Historical Provider, MD   BP 181/77 mmHg  Pulse 59  Temp(Src) 97.5 F (36.4 C) (Oral)  Resp 14  Ht '5\' 7"'$  (1.702 m)  Wt 82.101 kg  BMI 28.34 kg/m2  SpO2 99% Physical Exam   Constitutional: He is oriented to person, place, and time. He appears well-developed and well-nourished. No distress.  HENT:  Head: Normocephalic.  Tenderness to central occipital region. No deformity or instability. No wound.  Eyes: Conjunctivae and EOM are normal. Pupils are equal, round, and reactive to light.  Neck: Normal range of motion. Neck supple.  Cardiovascular: Normal rate, regular rhythm, normal heart sounds and intact distal pulses.   Pulmonary/Chest: Effort normal and breath sounds normal. No respiratory distress.  Abdominal: Soft. There is tenderness in the left lower quadrant. There is no guarding.  Musculoskeletal: He exhibits no edema or tenderness.  Lymphadenopathy:    He has no cervical adenopathy.  Neurological: He is alert and oriented to person, place, and time. He has normal reflexes.  Skin: Skin is warm and dry. He is not diaphoretic.  Psychiatric: He has a normal mood and affect. His behavior is normal.  Nursing note and vitals reviewed.   ED Course  Procedures (including critical care time) Labs Review Labs Reviewed  COMPREHENSIVE METABOLIC PANEL - Abnormal; Notable for the following:    Glucose, Bld 102 (*)    AST 46 (*)    GFR calc non Af Amer 56 (*)    All other components within normal limits  URINE CULTURE  CBC WITH DIFFERENTIAL/PLATELET  PROTIME-INR  URINALYSIS, ROUTINE W REFLEX MICROSCOPIC (NOT AT [facility])  POCT CBG (FASTING - GLUCOSE)-MANUAL ENTRY  I-STAT TROPOININ, ED  CBG MONITORING, ED    Imaging Review Dg Chest 2 View  05/20/2015  CLINICAL DATA:  82 year old male who fell today with pain and confusion. Initial encounter. EXAM: CHEST  2 VIEW COMPARISON:  04/12/2014 and earlier. FINDINGS: Seated upright AP and lateral views the chest. Chronically low lung volumes, chronically low lung volumes, slightly lower today. Stable cardiac size and mediastinal contours. Bibasilar atelectasis or scarring is stable. No pneumothorax, pulmonary edema,  pleural effusion or confluent pulmonary opacity. Chronic appearing bilateral rib fractures. No acute osseous abnormality identified. Extensive calcified aortic atherosclerosis. IMPRESSION: Low lung volumes, otherwise no acute cardiopulmonary abnormality. Electronically Signed   By: Genevie Ann M.D.   On: 05/20/2015 13:36   Ct Head Wo Contrast  05/20/2015  CLINICAL DATA:  Head injury after fall yesterday in bathtub. No loss of consciousness. EXAM: CT HEAD WITHOUT CONTRAST CT CERVICAL SPINE WITHOUT CONTRAST TECHNIQUE: Multidetector CT imaging of the head and cervical spine was performed following the standard protocol without intravenous contrast. Multiplanar CT image reconstructions of the cervical spine were also generated. COMPARISON:  CT scan of head of November 28, 2008. FINDINGS: CT HEAD FINDINGS Bony calvarium is intact. Mild diffuse cortical atrophy is noted. Mild chronic ischemic white matter disease is noted. No mass effect or midline shift is noted. Ventricular size is within normal limits. There is no  evidence of mass lesion, hemorrhage or acute infarction. CT CERVICAL SPINE FINDINGS No fracture or significant spondylolisthesis is noted. Moderate degenerative disc disease is noted at C5-6. Fusion of the C6 and C7 vertebral bodies is noted most likely due to degenerative disease. Anterior osteophyte formation is noted at C3-4 and C4-5. Degenerative changes seen involving the posterior facet joints is well. Visualized lung apices are unremarkable. IMPRESSION: Mild diffuse cortical atrophy. Mild chronic ischemic white matter disease. No acute intracranial abnormality seen. Degenerative changes are noted in the lower cervical spine. No acute fracture or spondylolisthesis is noted. Electronically Signed   By: Marijo Conception, M.D.   On: 05/20/2015 13:34   Ct Cervical Spine Wo Contrast  05/20/2015  CLINICAL DATA:  Head injury after fall yesterday in bathtub. No loss of consciousness. EXAM: CT HEAD WITHOUT  CONTRAST CT CERVICAL SPINE WITHOUT CONTRAST TECHNIQUE: Multidetector CT imaging of the head and cervical spine was performed following the standard protocol without intravenous contrast. Multiplanar CT image reconstructions of the cervical spine were also generated. COMPARISON:  CT scan of head of November 28, 2008. FINDINGS: CT HEAD FINDINGS Bony calvarium is intact. Mild diffuse cortical atrophy is noted. Mild chronic ischemic white matter disease is noted. No mass effect or midline shift is noted. Ventricular size is within normal limits. There is no evidence of mass lesion, hemorrhage or acute infarction. CT CERVICAL SPINE FINDINGS No fracture or significant spondylolisthesis is noted. Moderate degenerative disc disease is noted at C5-6. Fusion of the C6 and C7 vertebral bodies is noted most likely due to degenerative disease. Anterior osteophyte formation is noted at C3-4 and C4-5. Degenerative changes seen involving the posterior facet joints is well. Visualized lung apices are unremarkable. IMPRESSION: Mild diffuse cortical atrophy. Mild chronic ischemic white matter disease. No acute intracranial abnormality seen. Degenerative changes are noted in the lower cervical spine. No acute fracture or spondylolisthesis is noted. Electronically Signed   By: Marijo Conception, M.D.   On: 05/20/2015 13:34   Mr Brain Wo Contrast  05/20/2015   [facility] [address], [place], [place] [address] [phone] NEUROIMAGING REPORT STUDY DATE: 05/19/2015 PATIENT NAME: WONG STEADHAM DOB: 1926-09-08 MRN: [id number] EXAM: MRI Brain without contrast ORDERING CLINICIAN: Star Age, MD, PhD CLINICAL HISTORY: 83 year old man with dementia COMPARISON FILMS: MRI 01/22/2012 TECHNIQUE: MRI of the brain without contrast was obtained utilizing 5 mm axial slices with T1, T2, T2 flair, SWI and diffusion weighted views.  T1 sagittal and T2 coronal views were obtained. CONTRAST: none IMAGING SITE: [place]  [facility], [place], [place] FINDINGS: On sagittal images, the spinal cord is imaged caudally to C3 and is normal in caliber.   The contents of the posterior fossa are of normal size and position.   The pituitary gland and optic chiasm appear normal.    The fourth ventricle was normal size. The third and lateral ventricles are mildly enlarged proportional to the extent of mild to moderate cortical atrophy. The atrophy is a little more pronounced in the mesial temporal lobes.  There are no abnormal extra-axial collections of fluid.  The cerebellum and brainstem appears normal.   A couple punctate T2/FLAIR hyperintense foci are noted in the basal ganglia area.  In the hemispheres, there are scattered T2/FLAIR hyperintense foci in the subcortical deep and periventricular white matter. Confluencies are noted adjacent to the atria of both lateral ventricles in the parietal lobes.  The orbits appear normal.   The VIIth/VIIIth nerve complex appears  normal.  The mastoid air cells appear normal.  There is a retention cyst within the left maxillary sinus. The other paranasal sinuses appear normal.  Flow voids are identified within the major intracerebral arteries.   Diffusion weighted images are normal.  Susceptibility weighted images are normal.    05/20/2015   This MRI of the brain without contrast shows the following: 1.   Scattered T2/FLAIR hyperintense foci in the white matter of both hemispheres with confluencies adjacent to the atria of both lateral ventricles most consistent with chronic microvascular ischemic changes.   None of these foci appear to be acute. 2.    Mild to moderate generalized cortical atrophy that is a little more pronounced in the mesial temporal lobes 3.   There are no acute findings. INTERPRETING PHYSICIAN: Richard A. Felecia Shelling, MD, PhD Certified in  Neuroimaging by [employer]   Ct Abdomen Pelvis W Contrast  05/20/2015  CLINICAL DATA:  Intermittent mid to LEFT  abdominal pain beginning a year ago, LEFT lower quadrant pain and tenderness, fell yesterday, history hypertension, diabetes mellitus EXAM: CT ABDOMEN AND PELVIS WITH CONTRAST TECHNIQUE: Multidetector CT imaging of the abdomen and pelvis was performed using the standard protocol following bolus administration of intravenous contrast. Sagittal and coronal MPR images reconstructed from axial data set. CONTRAST:  183m ISOVUE-300 IOPAMIDOL (ISOVUE-300) INJECTION 61% IV. Oral contrast not administered. COMPARISON:  04/12/2014 FINDINGS: Minimal dependent bibasilar atelectasis. Small cyst inferior pole LEFT kidney 13 x 11 mm image 31. Liver, spleen, pancreas, kidneys, and adrenal glands otherwise normal. Gallbladder surgically absent. Normal appendix. Beam hardening artifacts from orthopedic hardware in the lumbar spine post lumbar fusion. Stomach and bowel loops normal appearance for technique. Scattered atherosclerotic calcifications without aneurysm. Mildly distended bladder with unremarkable ureters. No mass, adenopathy, free air, free fluid or inflammatory process. No acute osseous findings. IMPRESSION: Small LEFT renal cyst. Mildly distended urinary bladder. Prior lumbar fusion. No acute intra-abdominal or intrapelvic abnormalities. Electronically Signed   By: MLavonia DanaM.D.   On: 05/20/2015 16:09   Dg Hip Unilat With Pelvis 2-3 Views Left  05/20/2015  CLINICAL DATA:  82year old male who fell today with pain and confusion. Initial encounter. EXAM: DG HIP (WITH OR WITHOUT PELVIS) 2-3V LEFT COMPARISON:  CT Abdomen and Pelvis 04/12/2014 FINDINGS: Partially visible chronic lower lumbar fusion hardware. Bone mineralization is within normal limits for age. Both femoral heads remain normally located. Pelvis intact. Sacral ala appear intact. Proximal right femur appears intact. Proximal left femur intact. Calcified peripheral vascular disease. IMPRESSION: No acute fracture or dislocation identified about the left hip  or pelvis. Electronically Signed   By: HGenevie AnnM.D.   On: 05/20/2015 13:38   I have personally reviewed and evaluated these images and lab results as part of my medical decision-making.   EKG Interpretation None      MDM   Final diagnoses:  Fall, initial encounter    Logan NDOMANIQUE LUCKETTpresents for evaluation following a fall with head trauma yesterday morning. Patient complains of head, left hip, and abdominal pain.  Findings and plan of care discussed with SVirgel Manifold MD. Dr. KWilson Singerpersonally evaluated and examined this patient.  Patient is alert and oriented, and has no neuro or functional deficits. No red flag symptoms. Patient maintains that he did not lose consciousness or have any concerning symptoms prior to his fall such as dizziness, chest pain, or shortness of breath. No acute abnormalities on the patient's head or C-spine CT. No abnormalities on  patient's x-rays. No acute abnormalities on the patient's labs. When the option of admission for observation and further syncope workup was presented to the patient, he adamantly stated that he did not want to be admitted to the hospital. Repeat neuro exams reveal no abnormalities. In addition to the evidence listed here, it is important to note that the patient had his fall over 24 hours ago and had no ill effects from it. Patient's pain was treated with conservative management. Abdominal CT shows no acute abnormalities. Return precautions discussed. Patient voiced understanding of these instructions and is comfortable with discharge. Patient meets no admission criteria at this time. Patient appears safe for discharge.    Filed Vitals:   05/20/15 1345 05/20/15 1400 05/20/15 1415 05/20/15 1430  BP: 169/80 190/78 202/89 192/83  Pulse: 59 60 59 58  Temp:      TempSrc:      Resp: '13 17 13 13  '$ Height:      Weight:      SpO2: 100% 100% 100% 100%   Filed Vitals:   05/20/15 1400 05/20/15 1415 05/20/15 1430 05/20/15 1558  BP: 190/78  202/89 192/83 181/77  Pulse: 60 59 58 59  Temp:      TempSrc:      Resp: '17 13 13 14  '$ Height:      Weight:      SpO2: 100% 100% 100% 99%       Lorayne Bender, PA-C 05/20/15 1651  Virgel Manifold, MD 05/23/15 1339

## 2015-05-21 NOTE — ED Notes (Signed)
Attempted to return the refused Tramadol, however, patient was already discharged from the system. Unable to return medication to [facility]. 50mg  Tramadol pill given to Logan GamblesVeronda Bryk, pharmacist in the ED. Note given with medication as well with patient information and reason why.

## 2015-05-22 LAB — URINE CULTURE

## 2015-05-24 ENCOUNTER — Telehealth: Payer: Self-pay | Admitting: *Deleted

## 2015-05-24 NOTE — Telephone Encounter (Signed)
-----   Message from Huston FoleySaima Athar, MD sent at 05/20/2015  2:12 PM EDT ----- Please call patient regarding the recent brain MRI: The brain scan showed a normal structure of the brain and moderate volume loss which we call atrophy. There were changes in the deeper structures of the brain, which we call white matter changes or microvascular changes. These were reported as moderate in His case. These are tiny white spots, that occur with time and are seen in a variety of conditions, including with normal aging, chronic hypertension, chronic headaches, especially migraine HAs, chronic diabetes, chronic hyperlipidemia. These are not strokes and no mass or lesion were seen which is reassuring. Again, there were no acute findings, such as a stroke, or mass or blood products. No further action is required on this test at this time, other than re-enforcing the importance of good blood pressure control, good cholesterol control, good blood sugar control, and weight management. Please remind patient to keep any upcoming appointments or tests and to call us with any interim questions, concerns, problems or updates. Thanks,  Huston FoleySaima Athar, MD, PhD

## 2015-05-24 NOTE — Telephone Encounter (Signed)
LVM for daughter-in-law to call back about results. Gave [facility] phone number and hours.

## 2015-05-25 NOTE — Telephone Encounter (Signed)
Spoke to Logan Rush - she is aware of results and verbalized understanding.  They will keep his pending follow up appointment.

## 2015-06-03 ENCOUNTER — Emergency Department (HOSPITAL_COMMUNITY)
Admission: EM | Admit: 2015-06-03 | Discharge: 2015-06-04 | Disposition: A | Discharge status: Home / Self Care | Payer: Medicare PPO | Attending: Emergency Medicine | Admitting: Emergency Medicine

## 2015-06-03 ENCOUNTER — Emergency Department (HOSPITAL_COMMUNITY): Payer: Medicare PPO

## 2015-06-03 ENCOUNTER — Encounter (HOSPITAL_COMMUNITY): Payer: Self-pay | Admitting: *Deleted

## 2015-06-03 DIAGNOSIS — E119 Type 2 diabetes mellitus without complications: Secondary | ICD-10-CM | POA: Diagnosis not present

## 2015-06-03 DIAGNOSIS — K5900 Constipation, unspecified: Secondary | ICD-10-CM | POA: Diagnosis present

## 2015-06-03 DIAGNOSIS — E7800 Pure hypercholesterolemia, unspecified: Secondary | ICD-10-CM | POA: Diagnosis not present

## 2015-06-03 DIAGNOSIS — E039 Hypothyroidism, unspecified: Secondary | ICD-10-CM | POA: Diagnosis not present

## 2015-06-03 DIAGNOSIS — Z79899 Other long term (current) drug therapy: Secondary | ICD-10-CM | POA: Diagnosis not present

## 2015-06-03 DIAGNOSIS — I10 Essential (primary) hypertension: Secondary | ICD-10-CM | POA: Diagnosis not present

## 2015-06-03 DIAGNOSIS — Z7982 Long term (current) use of aspirin: Secondary | ICD-10-CM | POA: Insufficient documentation

## 2015-06-03 DIAGNOSIS — R338 Other retention of urine: Secondary | ICD-10-CM

## 2015-06-03 DIAGNOSIS — R630 Anorexia: Secondary | ICD-10-CM | POA: Insufficient documentation

## 2015-06-03 DIAGNOSIS — E785 Hyperlipidemia, unspecified: Secondary | ICD-10-CM | POA: Insufficient documentation

## 2015-06-03 DIAGNOSIS — R339 Retention of urine, unspecified: Secondary | ICD-10-CM | POA: Diagnosis not present

## 2015-06-03 DIAGNOSIS — R34 Anuria and oliguria: Secondary | ICD-10-CM | POA: Diagnosis not present

## 2015-06-03 LAB — URINALYSIS, ROUTINE W REFLEX MICROSCOPIC
BILIRUBIN URINE: NEGATIVE
Glucose, UA: NEGATIVE mg/dL
HGB URINE DIPSTICK: NEGATIVE
Ketones, ur: NEGATIVE mg/dL
Leukocytes, UA: NEGATIVE
NITRITE: NEGATIVE
PROTEIN: NEGATIVE mg/dL
Specific Gravity, Urine: 1.006 (ref 1.005–1.030)
pH: 7 (ref 5.0–8.0)

## 2015-06-03 LAB — CBC WITH DIFFERENTIAL/PLATELET
BASOS ABS: 0 10*3/uL (ref 0.0–0.1)
Basophils Relative: 0 %
EOS ABS: 0.1 10*3/uL (ref 0.0–0.7)
EOS PCT: 2 %
HCT: 38.9 % — ABNORMAL LOW (ref 39.0–52.0)
HEMOGLOBIN: 13.1 g/dL (ref 13.0–17.0)
LYMPHS PCT: 23 %
Lymphs Abs: 1.6 10*3/uL (ref 0.7–4.0)
MCH: 31.4 pg (ref 26.0–34.0)
MCHC: 33.7 g/dL (ref 30.0–36.0)
MCV: 93.3 fL (ref 78.0–100.0)
Monocytes Absolute: 0.4 10*3/uL (ref 0.1–1.0)
Monocytes Relative: 6 %
NEUTROS PCT: 69 %
Neutro Abs: 4.8 10*3/uL (ref 1.7–7.7)
PLATELETS: 170 10*3/uL (ref 150–400)
RBC: 4.17 MIL/uL — AB (ref 4.22–5.81)
RDW: 12.7 % (ref 11.5–15.5)
WBC: 6.9 10*3/uL (ref 4.0–10.5)

## 2015-06-03 LAB — COMPREHENSIVE METABOLIC PANEL
ALK PHOS: 67 U/L (ref 38–126)
ALT: 21 U/L (ref 17–63)
AST: 28 U/L (ref 15–41)
Albumin: 3.9 g/dL (ref 3.5–5.0)
Anion gap: 13 (ref 5–15)
BUN: 8 mg/dL (ref 6–20)
CHLORIDE: 102 mmol/L (ref 101–111)
CO2: 22 mmol/L (ref 22–32)
CREATININE: 1.35 mg/dL — AB (ref 0.61–1.24)
Calcium: 9.5 mg/dL (ref 8.9–10.3)
GFR calc Af Amer: 53 mL/min — ABNORMAL LOW (ref >60)
GFR calc non Af Amer: 46 mL/min — ABNORMAL LOW (ref >60)
GLUCOSE: 162 mg/dL — AB (ref 65–99)
Potassium: 4.5 mmol/L (ref 3.5–5.1)
SODIUM: 137 mmol/L (ref 135–145)
Total Bilirubin: 1 mg/dL (ref 0.3–1.2)
Total Protein: 7.6 g/dL (ref 6.5–8.1)

## 2015-06-03 LAB — LIPASE, BLOOD: Lipase: 82 U/L — ABNORMAL HIGH (ref 11–51)

## 2015-06-03 LAB — LACTIC ACID, PLASMA: Lactic Acid, Venous: 1.5 mmol/L (ref 0.5–2.0)

## 2015-06-03 MED ORDER — FLEET ENEMA 7-19 GM/118ML RE ENEM
1.0000 | ENEMA | Freq: Once | RECTAL | Status: AC
  Administered 2015-06-04: 1 via RECTAL
  Filled 2015-06-03: qty 1

## 2015-06-03 MED ORDER — IOPAMIDOL (ISOVUE-300) INJECTION 61%
INTRAVENOUS | Status: AC
  Filled 2015-06-03: qty 100

## 2015-06-03 MED ORDER — BISACODYL 10 MG RE SUPP
10.0000 mg | Freq: Once | RECTAL | Status: DC
  Filled 2015-06-03: qty 1

## 2015-06-03 MED ORDER — SODIUM CHLORIDE 0.9 % IV BOLUS (SEPSIS)
1000.0000 mL | Freq: Once | INTRAVENOUS | Status: AC
  Administered 2015-06-04: 1000 mL via INTRAVENOUS

## 2015-06-03 MED ORDER — MORPHINE SULFATE (PF) 4 MG/ML IV SOLN
4.0000 mg | Freq: Once | INTRAVENOUS | Status: AC
  Administered 2015-06-03: 4 mg via INTRAVENOUS
  Filled 2015-06-03: qty 1

## 2015-06-03 NOTE — ED Notes (Signed)
Family at bedside. 

## 2015-06-03 NOTE — ED Provider Notes (Signed)
CSN: [id number]     Arrival date & time 06/03/15  1746 History   First MD Initiated Contact with Patient 06/03/15 1839     Chief Complaint  Patient presents with  . Urinary Retention  . Constipation     (Consider location/radiation/quality/duration/timing/severity/associated sxs/prior Treatment) HPI 82 y.o. male with a remote history of urinary retention which required a single in and out catheterization, as well as chronic low back pain on opiate pain medications, presents to the ED noting 3-4 day history of constipation sx passing small volume, non-bloody hard stools daily, last of which was yesterday AM. He has noted progressively worsening lower abdominal pain and rectal pain since the onset of these sx and today he began noticing urinary retention sx. He was last able to urinate around 9:00AM this morning and though he has tried and had significant urge to urinate he has been unable. He has noted progressively worsening abdominal distention and pain as a result of this. He denies any recent dysuria sx, urinary frequency or hematuria. He denies any hx of prior prostate surgery or medication for urinary retention. He has a hx of prior significant abdominal surgery years ago, and s/p cholecystectomy more recently. No hx of cancer. No fever, chills, nausea, vomiting, saddle anesthesia.      Past Medical History  Diagnosis Date  . Hypertension   . Hyperchloremia   . Hypothyroid   . Hyperlipemia   . Diabetes mellitus without complication [facility])    Past Surgical History  Procedure Laterality Date  . Back surgery    . Cholecystectomy     No family history on file. Social History  Substance Use Topics  . Smoking status: Never Smoker   . Smokeless tobacco: Never Used  . Alcohol Use: No    Review of Systems  Constitutional: Positive for activity change and appetite change. Negative for fever and chills.  HENT: Negative for congestion, sinus pressure and sore throat.   Respiratory:  Negative for chest tightness and shortness of breath.   Cardiovascular: Negative for chest pain.  Gastrointestinal: Positive for abdominal pain, constipation, abdominal distention and rectal pain. Negative for nausea, vomiting, diarrhea, blood in stool and anal bleeding.  Genitourinary: Positive for decreased urine volume and difficulty urinating. Negative for dysuria, hematuria, flank pain, penile swelling, scrotal swelling, penile pain and testicular pain.  Musculoskeletal: Negative for back pain and neck pain.  Skin: Negative for rash.  Neurological: Negative for dizziness, syncope, weakness and headaches.  All other systems reviewed and are negative.     Allergies  Review of patient's allergies indicates no known allergies.  Home Medications   Prior to Admission medications   Medication Sig Start Date End Date Taking? Authorizing Provider  aspirin 81 MG tablet Take 81 mg by mouth daily.   Yes Historical Provider, MD  atorvastatin (LIPITOR) 20 MG tablet Take 20 mg by mouth daily.   Yes Historical Provider, MD  donepezil (ARICEPT) 5 MG tablet Take 1 tablet (5 mg total) by mouth at bedtime. 04/26/15  Yes Star Age, MD  levothyroxine (SYNTHROID, LEVOTHROID) 112 MCG tablet Take 1 tablet (112 mcg total) by mouth daily before breakfast. 10/30/12  Yes Elayne Snare, MD  losartan (COZAAR) 50 MG tablet Take 1 tablet (50 mg total) by mouth daily. 12/25/13  Yes Elayne Snare, MD  acarbose (PRECOSE) 25 MG tablet Take 1 tablet (25 mg total) by mouth daily before breakfast. Patient not taking: Reported on 06/04/2015 04/15/13   Elayne Snare, MD  Blood Glucose Monitoring  Suppl (ONE TOUCH ULTRA SYSTEM KIT) W/DEVICE KIT 1 kit by Does not apply route once. 10/16/12   Elayne Snare, MD  HYDROcodone-acetaminophen (NORCO/VICODIN) 5-325 MG per tablet Take 1-2 tablets by mouth every 4 (four) hours as needed for moderate pain or severe pain. Patient not taking: Reported on 06/04/2015 04/12/14   Clayton Bibles, PA-C  nebivolol  (BYSTOLIC) 2.5 MG tablet Take 1 tablet (2.5 mg total) by mouth daily. Patient not taking: Reported on 06/04/2015 12/25/13   Elayne Snare, MD  ONE [facility] ULTRA TEST test strip USE TO CHECK BLOOD SUGAR EVERY DAY 11/01/14   Elayne Snare, MD  [place] LANCETS [address] [place] USE TWICE DAILY 12/06/14   Elayne Snare, MD  polyethylene glycol (MIRALAX / GLYCOLAX) packet Take 17 g by mouth See admin instructions. Please add 6 cap fulls of powder to a 32 oz gatorade or similar beverage and drink gradually tomorrow. After this you may continue using 1-2 cap fulls daily as needed for further constipation relief, titrating to the stool consistency of pudding. 06/04/15   Zenovia Jarred, DO   BP 161/79 mmHg  Pulse 78  Temp(Src) 98.4 F (36.9 C) (Oral)  Resp 15  Ht 5' 9" (1.753 m)  Wt 81.647 kg  BMI 26.57 kg/m2  SpO2 100% Physical Exam  Constitutional: He is oriented to person, place, and time. He appears well-developed and well-nourished. He appears distressed.  HENT:  Head: Normocephalic and atraumatic.  Nose: Nose normal.  Mouth/Throat: Oropharynx is clear and moist.  Eyes: Conjunctivae and EOM are normal. Pupils are equal, round, and reactive to light.  Neck: Neck supple.  Cardiovascular: Normal rate, regular rhythm, normal heart sounds and intact distal pulses.   Pulmonary/Chest: Effort normal and breath sounds normal.  Abdominal: He exhibits distension. There is tenderness in the periumbilical area, suprapubic area and left lower quadrant. There is guarding. Hernia confirmed negative in the right inguinal area and confirmed negative in the left inguinal area.  Genitourinary: Testes normal and penis normal. Uncircumcised. No penile tenderness.  No saddle anesthesia. No perirectal abscess or cellulitis.   Musculoskeletal: He exhibits no tenderness.     Neurological: He is alert and oriented to person, place, and time. No cranial nerve deficit. Coordination normal.  Skin: Skin is warm and dry. No rash  noted. He is not diaphoretic.  Nursing note and vitals reviewed.   ED Course  Procedures (including critical care time) Labs Review Labs Reviewed  CBC WITH DIFFERENTIAL/PLATELET - Abnormal; Notable for the following:    RBC 4.17 (*)    HCT 38.9 (*)    All other components within normal limits  COMPREHENSIVE METABOLIC PANEL - Abnormal; Notable for the following:    Glucose, Bld 162 (*)    Creatinine, Ser 1.35 (*)    GFR calc non Af Amer 46 (*)    GFR calc Af Amer 53 (*)    All other components within normal limits  LIPASE, BLOOD - Abnormal; Notable for the following:    Lipase 82 (*)    All other components within normal limits  URINE CULTURE  URINALYSIS, ROUTINE W REFLEX MICROSCOPIC (NOT AT [facility])  LACTIC ACID, PLASMA  LACTIC ACID, PLASMA    Imaging Review Ct Abdomen Pelvis Wo Contrast  06/03/2015  CLINICAL DATA:  Urinary retention, diffuse abdominal pain, and constipation. EXAM: CT ABDOMEN AND PELVIS WITHOUT CONTRAST TECHNIQUE: Multidetector CT imaging of the abdomen and pelvis was performed following the standard protocol without IV contrast. COMPARISON:  05/20/2015 FINDINGS: Atelectasis in  the lung bases. Coronary artery calcifications. Surgical clips at the EG junction. Kidneys appear symmetrical in size and shape. No hydronephrosis or hydroureter. No renal, ureteral, or bladder stones. Bladder wall is not thickened. Vascular calcifications. The unenhanced appearance of the liver, spleen, gallbladder, pancreas, adrenal glands, abdominal aorta, and retroperitoneal lymph nodes is unremarkable. The inferior vena cava is flattened which may indicate hypovolemia. Stomach and small bowel are decompressed. Stool fills the colon without abnormal distention. No free air or free fluid in the abdomen. Abdominal wall musculature appears intact. Pelvis: Stool-filled rectum may indicate impaction. Appendix is not identified. No free or loculated pelvic fluid collections. No pelvic mass or  lymphadenopathy. Postoperative changes with posterior fixation from L2 through L5. Degenerative changes in the spine. No destructive bone lesions. IMPRESSION: No renal or ureteral stone or obstruction. Prominent stool in the rectum may indicate impaction. No bile dilatation. Electronically Signed   By: Lucienne Capers M.D.   On: 06/03/2015 22:48   I have personally reviewed and evaluated these images and lab results as part of my medical decision-making.   EKG Interpretation None      MDM  82 y.o. male with a history of constipation, remote hx of abdominal surgery, and chronic back pain on PO opiates occasionally for pain who presents to the ED noting several days of progressively worsening constipation sx with rectal pain, worsened today by the onset of urinary hesitancy. Last void was around 0900 and he has had progressively worsening distention and diffuse abdominal pain predominantly in the suprapubic region. Physical exam, as above, concerning for significant distention and guarding with generalized abdominal pain suggestive of urinary retention. He was cathed with an in and out cath and relieved 1825m of urine from his bladder. UA was negative for infection or hematuria. He noted significantly improved pain but noted persistent rectal and LLQ pain. CT scan was done and showed significant constipation, but no other acute intraabdominal abnormalities. He was given an enema (x2 because the first was unsuccessful) and he had a large bowel movement in the ED. He experienced significant relief of his sx after this.  He was also able to urinate without difficulty. Repeat abdominal examination was significantly improved and reassuring with no guarding or peritoneal signs. He noted some persistent mild discomfort in his LLQ, feel that this is likely due to some persistent consitpation. Low suspicion for diverticulitis, bowel obstruction, or acute intraabdominal abnormality given CT results and significant  iomprovement after enema in the ED. Low suspicion for cauda equina or neurologic etiology of his sx given chronic nature of his back pain, no hx of trauma and no saddle anesthesia. He was given an IV fluid bolus in the ED for his mildly elevated Cr from his baseline, likely due to obstructive nephropathy. He expressed the desire to go home. He was rx'd a PO miralax cleanout that he may take at home to continue his bowel cleanout and was recommended to take daily miralax as needed. He was recommended to follow up with Gastroenterology as an outpatient for further evaluation of his symptoms. This plan was discussed with the patient at the bedside and he stated both understanding and agreement.    Final diagnoses:  Constipation, unspecified constipation type  Acute urinary retention       WZenovia Jarred DO 06/04/15 0208  WZenovia Jarred DO 06/04/15 0210  RCarmin Muskrat MD 06/06/15 0010

## 2015-06-03 NOTE — ED Notes (Signed)
Patient transported to CT 

## 2015-06-03 NOTE — ED Notes (Signed)
Pt here for urinary retention since this am.  States difficulty urinating this am.  Also constipation and anal pain since yesterday.

## 2015-06-04 MED ORDER — POLYETHYLENE GLYCOL 3350 17 G PO PACK: 17.0000 g | PACK | ORAL | Status: AC

## 2015-06-04 NOTE — ED Notes (Signed)
Patient is alert and orientedx4.  Patient was explained discharge instructions and they understood them with no questions.  The patient's grandson, Renne MuscaSeraj Sheperd is taking the patient home.

## 2015-06-04 NOTE — Discharge Instructions (Signed)
Acute Urinary Retention, Male Acute urinary retention is when you are unable to pee (urinate). Acute urinary retention is common in older men. Prostates can get bigger, which blocks the flow of pee.  HOME CARE  Drink enough fluids to keep your pee clear or pale yellow.  If you are sent home with a tube that drains the bladder (catheter), there will be a drainage bag attached to it. There are two types of bags. One is big that you can wear at night without having to empty it. One is smaller and needs to be emptied more often.  Keep the drainage bag empty.  Keep the drainage bag lower than your catheter.  Only take medicine as told by your doctor. GET HELP IF:  You have a low-grade fever.  You have spasms or you are leaking pee when you have spasms. GET HELP RIGHT AWAY IF:   You have chills or a fever.  Your catheter stops draining pee.  Your catheter falls out.  You have increased bleeding that does not stop after you have rested and increased the amount of fluids you had been drinking. MAKE SURE YOU:   Understand these instructions.  Will watch your condition.  Will get help right away if you are not doing well or get worse.   This information is not intended to replace advice given to you by your health care provider. Make sure you discuss any questions you have with your health care provider.   Document Released: 07/18/2007 Document Revised: 06/15/2014 Document Reviewed: 07/10/2012 Elsevier Interactive Patient Education 2016 [employer].  Constipation, Adult Constipation is when a person has fewer than three bowel movements a week, has difficulty having a bowel movement, or has stools that are dry, hard, or larger than normal. As people grow older, constipation is more common. A low-fiber diet, not taking in enough fluids, and taking certain medicines may make constipation worse.  CAUSES   Certain medicines, such as antidepressants, pain medicine, iron supplements,  antacids, and water pills.   Certain diseases, such as diabetes, irritable bowel syndrome (IBS), thyroid disease, or depression.   Not drinking enough water.   Not eating enough fiber-rich foods.   Stress or travel.   Lack of physical activity or exercise.   Ignoring the urge to have a bowel movement.   Using laxatives too much.  SIGNS AND SYMPTOMS   Having fewer than three bowel movements a week.   Straining to have a bowel movement.   Having stools that are hard, dry, or larger than normal.   Feeling full or bloated.   Pain in the lower abdomen.   Not feeling relief after having a bowel movement.  DIAGNOSIS  Your health care provider will take a medical history and perform a physical exam. Further testing may be done for severe constipation. Some tests may include:  A barium enema X-ray to examine your rectum, colon, and, sometimes, your small intestine.   A sigmoidoscopy to examine your lower colon.   A colonoscopy to examine your entire colon. TREATMENT  Treatment will depend on the severity of your constipation and what is causing it. Some dietary treatments include drinking more fluids and eating more fiber-rich foods. Lifestyle treatments may include regular exercise. If these diet and lifestyle recommendations do not help, your health care provider may recommend taking over-the-counter laxative medicines to help you have bowel movements. Prescription medicines may be prescribed if over-the-counter medicines do not work.  HOME CARE INSTRUCTIONS   Eat foods  that have a lot of fiber, such as fruits, vegetables, whole grains, and beans.  Limit foods high in fat and processed sugars, such as french fries, hamburgers, cookies, candies, and soda.   A fiber supplement may be added to your diet if you cannot get enough fiber from foods.   Drink enough fluids to keep your urine clear or pale yellow.   Exercise regularly or as directed by your health  care provider.   Go to the restroom when you have the urge to go. Do not hold it.   Only take over-the-counter or prescription medicines as directed by your health care provider. Do not take other medicines for constipation without talking to your health care provider first.  SEEK IMMEDIATE MEDICAL CARE IF:   You have bright red blood in your stool.   Your constipation lasts for more than 4 days or gets worse.   You have abdominal or rectal pain.   You have thin, pencil-like stools.   You have unexplained weight loss. MAKE SURE YOU:   Understand these instructions.  Will watch your condition.  Will get help right away if you are not doing well or get worse.   This information is not intended to replace advice given to you by your health care provider. Make sure you discuss any questions you have with your health care provider.   Document Released: 10/28/2003 Document Revised: 02/19/2014 Document Reviewed: 11/10/2012 Elsevier Interactive Patient Education [employer]2016 Elsevier Inc.

## 2015-06-05 LAB — URINE CULTURE: Culture: NO GROWTH

## 2015-07-28 ENCOUNTER — Ambulatory Visit: Payer: Medicare PPO | Admitting: Neurology

## 2016-06-19 IMAGING — CT CT ABD-PELV W/O CM
2 of 4 series · 16 of 46 positions shown, 18 images · non-contrast
Comparison: 05/20/2015

CLINICAL DATA: Urinary retention, diffuse abdominal pain, and
constipation.

EXAM:
CT ABDOMEN AND PELVIS WITHOUT CONTRAST
TECHNIQUE: Multidetector CT imaging of the abdomen and pelvis was performed
following the standard protocol without IV contrast.

[Series 2: a/p w/o 5mm · axial · non-contrast · 0.75mm/px · z∈[-807,-347]mm · 13 of 100 slices shown, 15 images]
[im 4/100  soft-tissue]
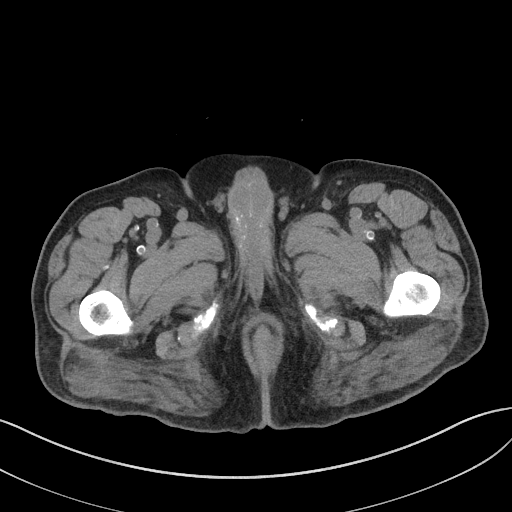
[im 4/100  bone]
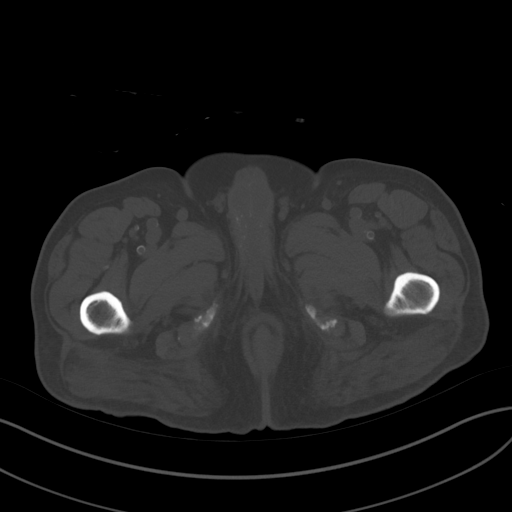
[im 12/100  soft-tissue]
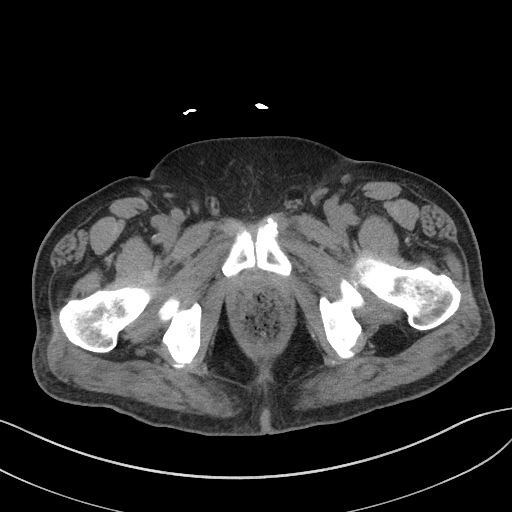
[im 20/100  soft-tissue]
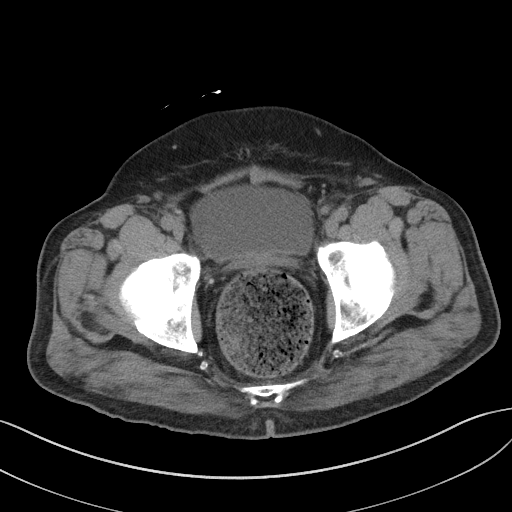
[im 27/100  soft-tissue]
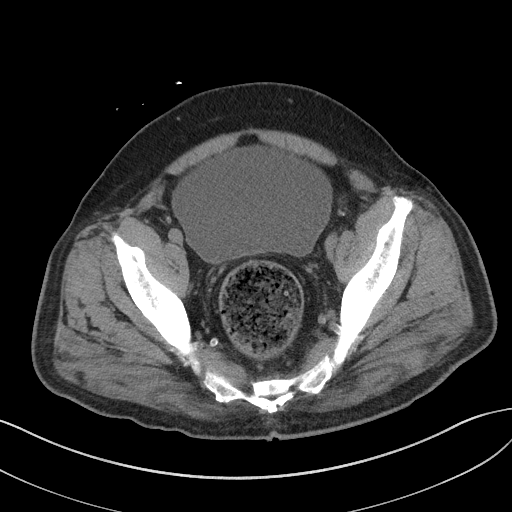
[im 35/100  soft-tissue]
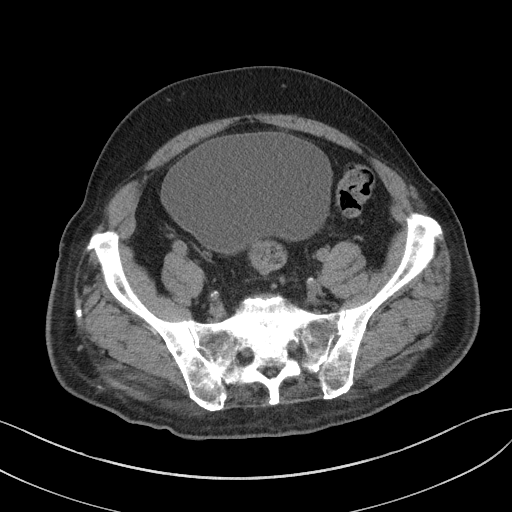
[im 42/100  soft-tissue]
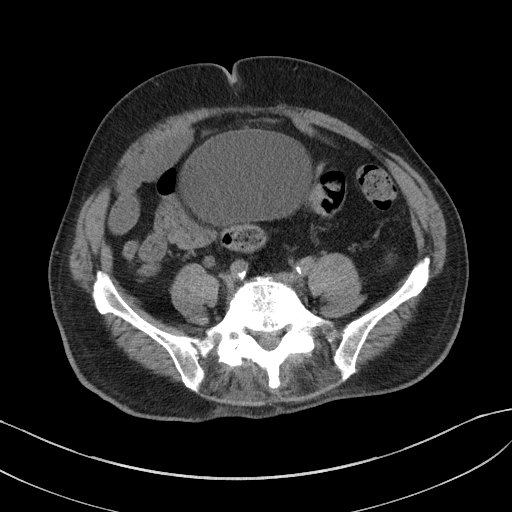
[im 50/100  soft-tissue]
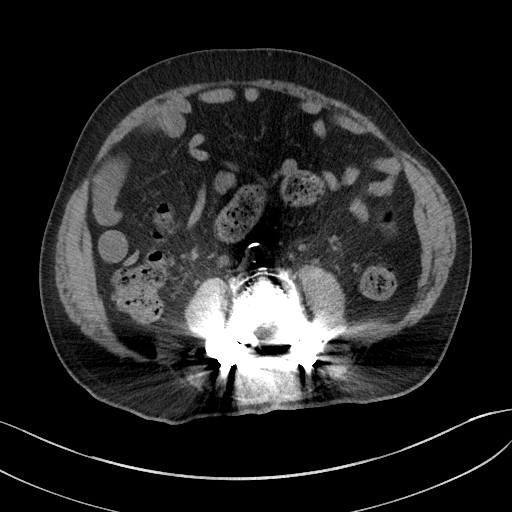
[im 58/100  soft-tissue]
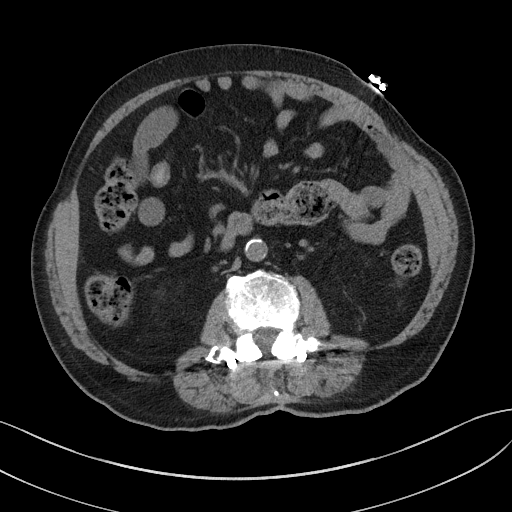
[im 65/100  soft-tissue]
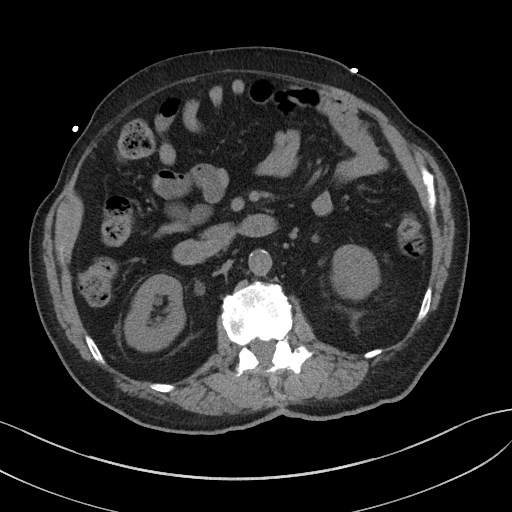
[im 65/100  bone]
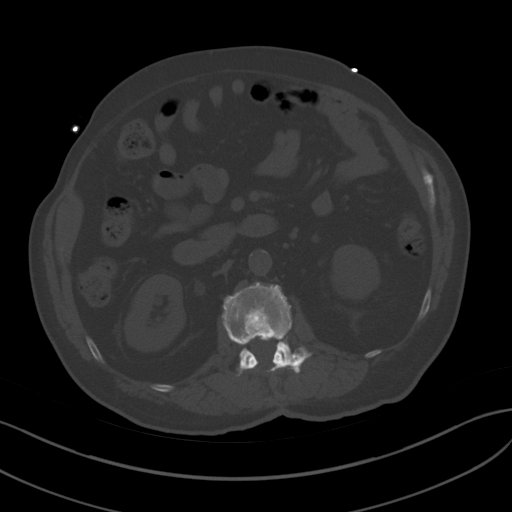
[im 73/100  soft-tissue]
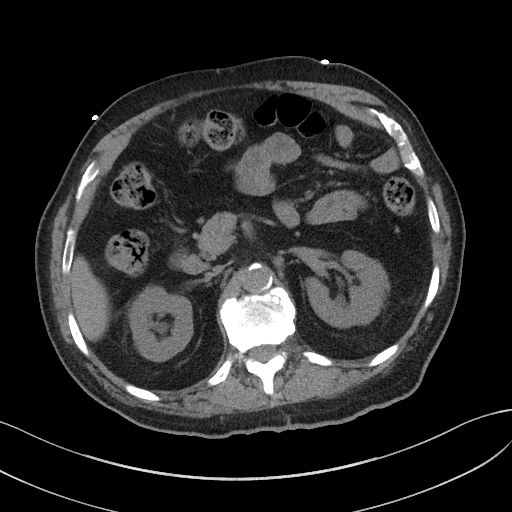
[im 80/100  soft-tissue]
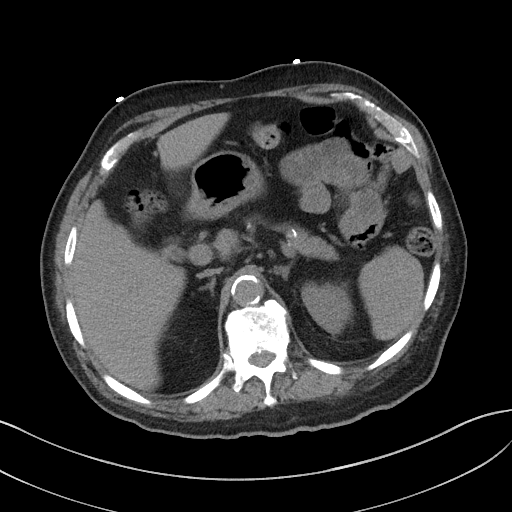
[im 88/100  soft-tissue]
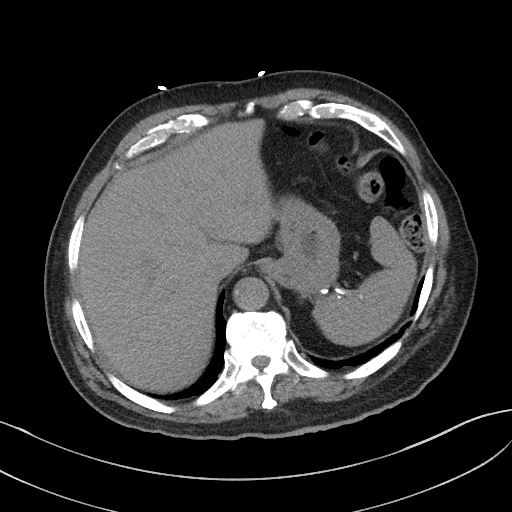
[im 96/100  soft-tissue]
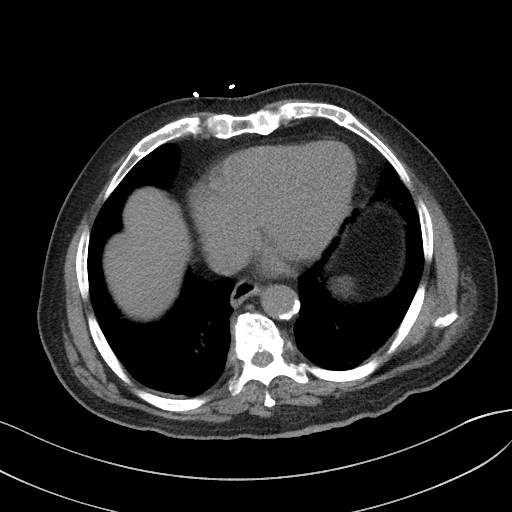

[Series 5: a/p w/o cor · coronal · non-contrast · 0.71mm/px · 3 of 133 slices shown]
[im 45/133  soft-tissue]
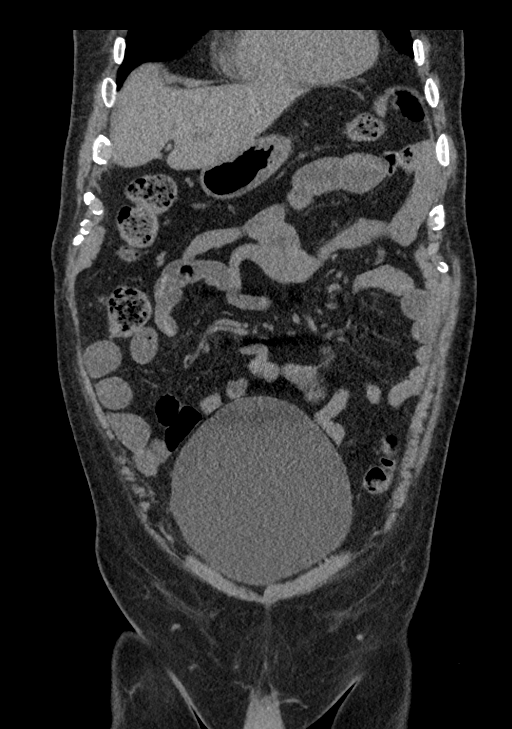
[im 59/133  soft-tissue]
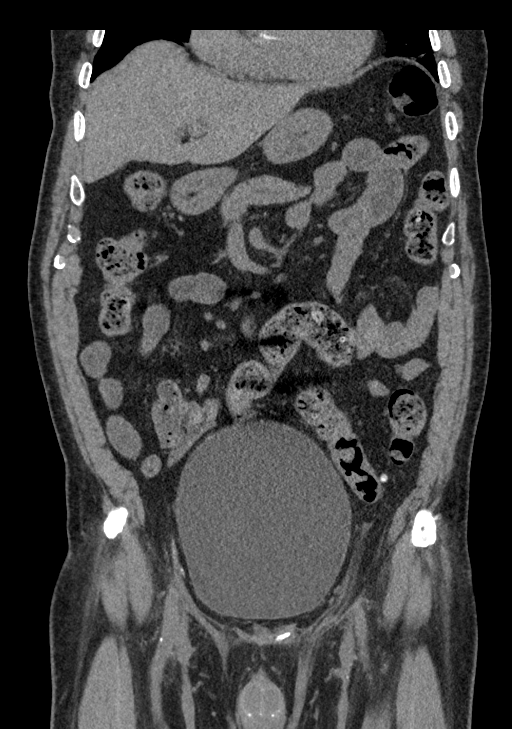
[im 74/133  soft-tissue]
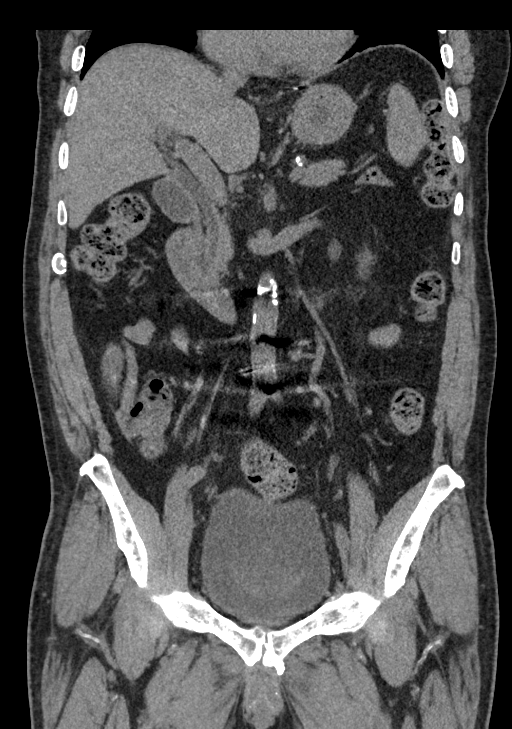

[16 of 46 positions shown; findings below may reference images not displayed]

FINDINGS: Atelectasis in the lung bases. Coronary artery calcifications.
Surgical clips at the EG junction.

Kidneys appear symmetrical in size and shape. No hydronephrosis or
hydroureter. No renal, ureteral, or bladder stones. Bladder wall is
not thickened.

Vascular calcifications. The unenhanced appearance of the liver,
spleen, gallbladder, pancreas, adrenal glands, abdominal aorta, and
retroperitoneal lymph nodes is unremarkable. The inferior vena cava
is flattened which may indicate hypovolemia.

Stomach and small bowel are decompressed. Stool fills the colon
without abnormal distention. No free air or free fluid in the
abdomen. Abdominal wall musculature appears intact.

Pelvis: Stool-filled rectum may indicate impaction. Appendix is not
identified. No free or loculated pelvic fluid collections. No pelvic
mass or lymphadenopathy. Postoperative changes with posterior
fixation from L2 through L5. Degenerative changes in the spine. No
destructive bone lesions.
IMPRESSION: No renal or ureteral stone or obstruction. Prominent stool in the
rectum may indicate impaction. No bile dilatation.

## 2016-08-12 DEATH — deceased
